# Patient Record
Sex: Male | Born: 1960 | Race: White | Hispanic: No | Smoking: Never smoker
Health system: Southern US, Community
[De-identification: ages and names within clinical notes are randomized; demographics above are authoritative.]

---

## 2005-07-25 ENCOUNTER — Ambulatory Visit (HOSPITAL_COMMUNITY): Admission: RE | Admit: 2005-07-25 | Discharge: 2005-07-26 | Payer: Self-pay | Admitting: Otolaryngology

## 2011-01-26 ENCOUNTER — Ambulatory Visit (HOSPITAL_COMMUNITY)
Admission: RE | Admit: 2011-01-26 | Discharge: 2011-01-26 | Disposition: A | Discharge status: Home / Self Care | Payer: BC Managed Care – HMO | Source: Ambulatory Visit | Attending: Ophthalmology | Admitting: Ophthalmology

## 2011-01-26 ENCOUNTER — Ambulatory Visit (HOSPITAL_COMMUNITY): Payer: BC Managed Care – HMO

## 2011-01-26 DIAGNOSIS — I10 Essential (primary) hypertension: Secondary | ICD-10-CM | POA: Insufficient documentation

## 2011-01-26 DIAGNOSIS — H33009 Unspecified retinal detachment with retinal break, unspecified eye: Secondary | ICD-10-CM | POA: Insufficient documentation

## 2011-01-26 LAB — BASIC METABOLIC PANEL
BUN: 15 mg/dL (ref 6–23)
CO2: 30 mEq/L (ref 19–32)
Calcium: 9.3 mg/dL (ref 8.4–10.5)
Chloride: 101 mEq/L (ref 96–112)
Creatinine, Ser: 0.82 mg/dL (ref 0.4–1.5)
GFR calc Af Amer: >60 mL/min (ref >60)
GFR calc non Af Amer: >60 mL/min (ref >60)
Glucose, Bld: 84 mg/dL (ref 70–99)
Potassium: 4.1 mEq/L (ref 3.5–5.1)
Sodium: 138 mEq/L (ref 135–145)

## 2011-01-26 LAB — CBC
HCT: 44 % (ref 39.0–52.0)
Hemoglobin: 15.4 g/dL (ref 13.0–17.0)
MCH: 32.6 pg (ref 26.0–34.0)
MCHC: 35 g/dL (ref 30.0–36.0)
MCV: 93 fL (ref 78.0–100.0)
Platelets: 189 10*3/uL (ref 150–400)
RBC: 4.73 MIL/uL (ref 4.22–5.81)
RDW: 11.6 % (ref 11.5–15.5)
WBC: 4.9 10*3/uL (ref 4.0–10.5)

## 2011-01-26 LAB — SURGICAL PCR SCREEN
MRSA, PCR: NEGATIVE
Staphylococcus aureus: NEGATIVE

## 2011-02-09 NOTE — Op Note (Signed)
[name], [name]                ACCOUNT NO.:  [id number]  MEDICAL RECORD NO.:  [id number]           PATIENT TYPE:  O  LOCATION:  [facility]                         FACILITY:  [facility]  PHYSICIAN:  Shade Flood, MD       DATE OF BIRTH:  Jan 03, 1961  DATE OF PROCEDURE:  01/26/2011 DATE OF DISCHARGE:  01/26/2011                              OPERATIVE REPORT   PREOPERATIVE DIAGNOSIS:  Rhegmatogenous retinal detachment, right eye.  POSTOPERATIVE DIAGNOSIS:  Rhegmatogenous retinal detachment, right eye.  PROCEDURE PERFORMED:  Pars plana vitrectomy with fluid-gas exchange, endolaser, silicone oil, right eye.  The patient was prepared and draped in the usual fashion for ocular surgery on the right eye and a solid lid speculum was placed.  A conjunctival displacement was performed using an utility conjunctival forceps.  The trocar cannula was placed at the 7:30 meridian.  The infusion line was then attached to the cannula and the tip of the cannula was visualized in the vitreous cavity and then secured to the drape.  Trocar cannulas were then placed at 9:30 and 2:30.  The light pipe and vitreous cutter were inserted.  The patient had a posterior vitreous detachment and an inferior rhegmatogenous retinal detachment with a small hole at the ora serrata at approximately the 5 o'clock meridian.  After removing the central portion of the vitreous which was separated, Perfluoron liquid was then placed over the posterior pole to ensure that the macula remained attached.  The macula was attached at the beginning of the procedure and remained so throughout the procedure.  The subretinal fluid was displaced anteriorly but the originating hole was too small for the fluid to exit.  A purposeful superior peripheral opening was made with vitreous cutter at approximately the 12:30 meridian near the vitreous base.  This was used to drain subretinal fluid using the silicone-tipped extrusion cannula.   Additional Perfluoron was used up to the posterior edge of this purposeful retinotomy.  Air- fluid exchange was then carried out successfully flattening the remaining portion of the retina anteriorly.  After the retina was fully be attached, then Perfluoron liquid was withdrawn using air- fluid exchange.    Endolaser was then applied around the purposeful retinotomy at the 12:30 meridian and over the inferior 3 clock hours between 3 o'clock and 6 o'clock.  Care was taken to treat along the vitreous base for the entire sector.  Residual pre-macular fluid  was removed with the silicone tipped extrusion  cannula. The Viscous fluid injection cannula  for 23-gauge was inserted and the silicone syringe for 5000 centistoke silicone oil was attached to it.  This silicone oil was then placed uneventfully allowing the gas to escape through the remaining open cannula.    Once the silicone liquid reached the back surface of the lens, a 26-gauge needle was used to remove the additional air while the air infusion line was clamped.  Once the air was removed, the palpable pressure of the globe was less than 10.  The superior cannulas were removed with concomitant closure using a cotton tip applicator.  The infusion cannula at the 7:30 meridian  was then removed in similar fashion.  The patient was given 100 mg of Ancef subconjunctivally using a 26-gauge needle under direct visualization.  The patient was given 2 mg of Decadron subconjunctivally in the same fashion.  The lid speculum and drapes were removed and the patient's eye was patched using polymyxin and bacitracin ointment.  A plastic eye shield was placed and he was uneventfully extubated.  He was transferred from the operating room to the postoperative recovery area with stable vital signs.          ______________________________ Shade Flood, MD     GG/MEDQ  D:  01/26/2011  T:  01/27/2011  Job:  [id number]  Electronically Signed by Shade Flood MD on 02/09/2011 09:28:41 AM

## 2011-06-07 ENCOUNTER — Ambulatory Visit (HOSPITAL_COMMUNITY)
Admission: RE | Admit: 2011-06-07 | Discharge: 2011-06-07 | Disposition: A | Discharge status: Home / Self Care | Payer: BC Managed Care – PPO | Source: Ambulatory Visit | Attending: Ophthalmology | Admitting: Ophthalmology

## 2011-06-07 DIAGNOSIS — S0550XA Penetrating wound with foreign body of unspecified eyeball, initial encounter: Secondary | ICD-10-CM | POA: Insufficient documentation

## 2011-06-07 DIAGNOSIS — H269 Unspecified cataract: Secondary | ICD-10-CM | POA: Insufficient documentation

## 2011-06-07 DIAGNOSIS — Z1889 Other specified retained foreign body fragments: Secondary | ICD-10-CM | POA: Insufficient documentation

## 2011-06-07 LAB — CBC
HCT: 43.8 % (ref 39.0–52.0)
Hemoglobin: 15.8 g/dL (ref 13.0–17.0)
MCH: 32.8 pg (ref 26.0–34.0)
MCHC: 36.1 g/dL — ABNORMAL HIGH (ref 30.0–36.0)
MCV: 91.1 fL (ref 78.0–100.0)
Platelets: 169 10*3/uL (ref 150–400)
RBC: 4.81 MIL/uL (ref 4.22–5.81)
RDW: 11.6 % (ref 11.5–15.5)
WBC: 5.5 10*3/uL (ref 4.0–10.5)

## 2011-06-07 LAB — BASIC METABOLIC PANEL
BUN: 16 mg/dL (ref 6–23)
CO2: 28 mEq/L (ref 19–32)
Calcium: 9.7 mg/dL (ref 8.4–10.5)
Chloride: 102 mEq/L (ref 96–112)
Creatinine, Ser: 0.67 mg/dL (ref 0.50–1.35)
GFR calc Af Amer: >60 mL/min (ref >60)
GFR calc non Af Amer: >60 mL/min (ref >60)
Glucose, Bld: 86 mg/dL (ref 70–99)
Potassium: 4 mEq/L (ref 3.5–5.1)
Sodium: 139 mEq/L (ref 135–145)

## 2011-06-07 LAB — SURGICAL PCR SCREEN
MRSA, PCR: NEGATIVE
Staphylococcus aureus: NEGATIVE

## 2011-07-05 NOTE — Op Note (Signed)
[name], [name]                ACCOUNT NO.:  [id number]  MEDICAL RECORD NO.:  [id number]  LOCATION:  [facility]                         FACILITY:  [facility]  PHYSICIAN:  Shade Flood, MD       DATE OF BIRTH:  1961-05-23  DATE OF PROCEDURE:  06/07/2011 DATE OF DISCHARGE:  06/07/2011                              OPERATIVE REPORT   PREOPERATIVE DIAGNOSES: 1. Prior retinal detachment repair using silicone oil. 2. Cataract, right eye.  PROCEDURE PERFORMED:  Pars plana vitrectomy with removal of silicone oil.  SECONDARY PROCEDURE:  Phacoemulsification with posterior chamber intraocular lens, right eye.  There were no complications.  No specimens for Pathology.  SURGEON:  Shade Flood, MD.  The patient was prepared and draped in the usual fashion for ocular surgery on the right eye and a solid lid speculum was placed.  The conjunctival peritomy was made at the 9:30 meridian.  The conjunctiva was cleaned and cauterized and infusion cannula was placed using a trocar at the 8:30 meridian and the infusion line was attached.  An MVR blade was then used to make an incision at the 9:30 meridian and a syringe with an 18-gauge Angiocath was placed through the incision and used to aspirate silicone oil from the posterior chamber while concurrent infusion was taking place.  Once the silicone oil was completely removed, the sclerotomy was closed using 7-0 Vicryl suture. A peripheral clear corneal incision was made for 3.5 mm centered at the 11 o'clock meridian and a 15 degrees blade was then used to make a stab incision at the 2:30 meridian of the clear cornea.  The keratome was then used to make a self-sealing incision into the anterior chamber.  A Bent 25-gauge needle was then used to perform a capsulorrhexis.  The lens was then hydrodissected using balanced salt solution and the lens nucleus was hydrodelineated.  The Chang chopper was inserted and used to rotate the lens within the capsular  bag to ensure adequate mobility. The phaco handpiece and Chang chopper were then inserted and a combined phaco chop technique was employed fracturing the lens into sections. The phaco handpiece was then used to remove the remaining fragments of the nucleus from the eye uneventfully.  The IA cannula was used to remove the cortex from the capsular bag.  The patient was noted to have a small area of fibrosis in the posterior capsule, which was not obscuring the visual axis.  I elected not to alter the capsule in the operating room.  Should it become visually significant I can address it with laser in postoperative period.    The wound was enlarged to its full extent and Provisc was placed into the anterior chamber.  The Monarch injector was then used to place a folded AcrySof MA50BM posterior chamber intraocular lens, + 23 diopters.  This was placed uneventfully into the capsule bag and the Sinskey lens hook was used to dial the trailing haptic into the capsular bag.  The IA cannula was then used to remove the viscoelastic from the anterior chamber and a single interrupted 10-0 nylon suture was placed to secure the wound while we did air-fluid exchange to remove residual silicone  droplets.  A trocar cannula was placed at 9:30 and at 2:30 and the light pipe and vitreous cutter were inserted.  Air-fluid exchange was carried out with removal of silicone droplets from the fluid interface then balanced salt solution was reinstilled in the posterior chamber and the air removed.  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  There was no peripheral retinal traction and no retinal breaks noted on careful examination.  The cannulas were then removed after the infusion was turned down to 10 mm and then the infusion cannula was removed with concomitant closure using a cotton tip applicator.  The patient  then had the conjunctiva closed at the 9:30 meridian using a 6-0 plain gut suture.  The subconjunctival space was irrigated using an Olive tip cannula with 100 mg of Ancef and 4 mg of Decadron.  The lid speculum was removed and the patient was given a retrobulbar block using 4 mL of 0.75% Marcaine.  The eye was patched using polymyxin, bacitracin, ophthalmic ointment, and a plastic shield was placed.  He was uneventfully extubated and transferred from the operating room to the postoperative recovery area.          ______________________________ Shade Flood, MD     GG/MEDQ  D:  06/07/2011  T:  06/08/2011  Job:  [id number]  Electronically Signed by Shade Flood MD on 07/05/2011 02:35:47 PM

## 2021-07-12 ENCOUNTER — Ambulatory Visit: Payer: Self-pay | Admitting: Cardiovascular Disease

## 2021-07-12 ENCOUNTER — Encounter: Payer: Self-pay | Admitting: Cardiovascular Disease

## 2021-07-12 ENCOUNTER — Other Ambulatory Visit: Payer: Self-pay

## 2021-07-12 ENCOUNTER — Ambulatory Visit (INDEPENDENT_AMBULATORY_CARE_PROVIDER_SITE_OTHER): Payer: 59 | Admitting: Cardiovascular Disease

## 2021-07-12 DIAGNOSIS — E785 Hyperlipidemia, unspecified: Secondary | ICD-10-CM | POA: Insufficient documentation

## 2021-07-12 DIAGNOSIS — E782 Mixed hyperlipidemia: Secondary | ICD-10-CM | POA: Diagnosis not present

## 2021-07-12 DIAGNOSIS — I480 Paroxysmal atrial fibrillation: Secondary | ICD-10-CM | POA: Insufficient documentation

## 2021-07-12 DIAGNOSIS — I10 Essential (primary) hypertension: Secondary | ICD-10-CM | POA: Diagnosis not present

## 2021-07-12 NOTE — Progress Notes (Signed)
07/12/2021 [name]   June 29, 1961  [id number]  Primary Physician System, Provider Not In Primary Cardiologist: Runell Gess MD Nicholes Calam[place], [place]  HPI:  DRAYDON CLAIRMONT is a 60 y.o. mildly overweight divorced Caucasian male father of 3 children who works as a Psychologist, forensic.  He was referred by Dr. Wylene Simmer,  His PCP, for evaluation of PAF.  His cardiac risk factor profile is notable for treated hypertension and hyperlipidemia.  He does drink 3-5 diet Cokes a day.  There is no history of sleep apnea.  There is no family history of heart disease although his mother did have A. fib and had a stroke.  He has never had a heart attack or stroke.  Denies chest pain or shortness of breath.  He does walk 3-4 times a week and plays occasional tennis.  He developed a presyncopal episode on 06/23/2021 and was seen by his primary care doctor who noted that he was in A. fib.   Current Meds  Medication Sig   acyclovir (ZOVIRAX) 400 MG tablet Take 400 mg by mouth daily.   ELIQUIS 5 MG TABS tablet Take 5 mg by mouth 2 (two) times daily.   folic acid (FOLVITE) 400 MCG tablet Take 400 mg by mouth daily.   latanoprost (XALATAN) 0.005 % ophthalmic solution Place into both eyes at bedtime.   metoprolol tartrate (LOPRESSOR) 25 MG tablet Take 25 mg by mouth 2 (two) times daily.   Omega-3 1000 MG CAPS Take by mouth daily.   simvastatin (ZOCOR) 40 MG tablet Take 40 mg by mouth every evening.   timolol (TIMOPTIC) 0.5 % ophthalmic solution Place 1 drop into both eyes in the morning.     Not on File  Social History   Socioeconomic History   Marital status: Married    Spouse name: Not on file   Number of children: Not on file   Years of education: Not on file   Highest education level: Not on file  Occupational History   Not on file  Tobacco Use   Smoking status: Never   Smokeless tobacco: Never  Substance and Sexual Activity   Alcohol use: Not on file   Drug use: Not on file    Sexual activity: Not on file  Other Topics Concern   Not on file  Social History Narrative   Not on file   Social Determinants of Health   Financial Resource Strain: Not on file  Food Insecurity: Not on file  Transportation Needs: Not on file  Physical Activity: Not on file  Stress: Not on file  Social Connections: Not on file  Intimate Partner Violence: Not on file     Review of Systems: General: negative for chills, fever, night sweats or weight changes.  Cardiovascular: negative for chest pain, dyspnea on exertion, edema, orthopnea, palpitations, paroxysmal nocturnal dyspnea or shortness of breath Dermatological: negative for rash Respiratory: negative for cough or wheezing Urologic: negative for hematuria Abdominal: negative for nausea, vomiting, diarrhea, bright red blood per rectum, melena, or hematemesis Neurologic: negative for visual changes, syncope, or dizziness All other systems reviewed and are otherwise negative except as noted above.    Blood pressure 112/80, pulse 60, height 5\' 11"  (1.803 m), weight 194 lb 12.8 oz (88.4 kg), SpO2 99 %.  General appearance: alert and no distress Neck: no adenopathy, no carotid bruit, no JVD, supple, symmetrical, trachea midline, and thyroid not enlarged, symmetric, no tenderness/mass/nodules Lungs: clear to auscultation bilaterally  Heart: regular rate and rhythm, S1, S2 normal, no murmur, click, rub or gallop Extremities: extremities normal, atraumatic, no cyanosis or edema Pulses: 2+ and symmetric Skin: Skin color, texture, turgor normal. No rashes or lesions Neurologic: Grossly normal  EKG sinus rhythm at 60 without ST or T wave changes.  I personally reviewed this EKG.  ASSESSMENT AND PLAN:   Essential hypertension History of essential hypertension blood pressure measured today at 112/80.  He is on metoprolol.  Hyperlipidemia History of hyperlipidemia on statin therapy followed by his PCP  PAF (paroxysmal atrial  fibrillation) (HCC) History of PAF with recent presyncopal episode on 06/23/2021.  He saw his primary care doctor who demonstrated A. fib.  He is placed on Eliquis oral anticoagulation and metoprolol.  He is currently in sinus rhythm. This patients CHA2DS2-VASc Score and unadjusted Ischemic Stroke Rate (% per year) is equal to 0.6 % stroke rate/year from a score of 1  Above score calculated as 1 point each if present [CHF, HTN, DM, Vascular=MI/PAD/Aortic Plaque, Age if 65-74, or Male] Above score calculated as 2 points each if present [Age > 75, or Stroke/TIA/TE]      Runell Gess MD [facility], [facility] 07/12/2021 10:51 AM

## 2021-07-12 NOTE — Assessment & Plan Note (Signed)
History of essential hypertension blood pressure measured today at 112/80.  He is on metoprolol.

## 2021-07-12 NOTE — Assessment & Plan Note (Signed)
History of PAF with recent presyncopal episode on 06/23/2021.  He saw his primary care doctor who demonstrated A. fib.  He is placed on Eliquis oral anticoagulation and metoprolol.  He is currently in sinus rhythm. This patients CHA2DS2-VASc Score and unadjusted Ischemic Stroke Rate (% per year) is equal to 0.6 % stroke rate/year from a score of 1  Above score calculated as 1 point each if present [CHF, HTN, DM, Vascular=MI/PAD/Aortic Plaque, Age if 65-74, or Male] Above score calculated as 2 points each if present [Age > 75, or Stroke/TIA/TE]

## 2021-07-12 NOTE — Patient Instructions (Addendum)
Medication Instructions:  Your physician recommends that you continue on your current medications as directed. Please refer to the Current Medication list given to you today.  *If you need a refill on your cardiac medications before your next appointment, please call your pharmacy*   Testing/Procedures: Your physician has requested that you have an echocardiogram. Echocardiography is a painless test that uses sound waves to create images of your heart. It provides your doctor with information about the size and shape of your heart and how well your heart's chambers and valves are working. This procedure takes approximately one hour. There are no restrictions for this procedure. This procedure is done at [address]   Dr. Gwenlyn Found has ordered a CT coronary calcium score. This test is done at [address]. This is $99 out of pocket.   Coronary CalciumScan A coronary calcium scan is an imaging test used to look for deposits of calcium and other fatty materials (plaques) in the inner lining of the blood vessels of the heart (coronary arteries). These deposits of calcium and plaques can partly clog and narrow the coronary arteries without producing any symptoms or warning signs. This puts a person at risk for a heart attack. This test can detect these deposits before symptoms develop. Tell a health care provider about: Any allergies you have. All medicines you are taking, including vitamins, herbs, eye drops, creams, and over-the-counter medicines. Any problems you or family members have had with anesthetic medicines. Any blood disorders you have. Any surgeries you have had. Any medical conditions you have. Whether you are pregnant or may be pregnant. What are the risks? Generally, this is a safe procedure. However, problems may occur, including: Harm to a pregnant woman and her unborn baby. This test involves the use of radiation. Radiation exposure can be dangerous to a  pregnant woman and her unborn baby. If you are pregnant, you generally should not have this procedure done. Slight increase in the risk of cancer. This is because of the radiation involved in the test. What happens before the procedure? No preparation is needed for this procedure. What happens during the procedure? You will undress and remove any jewelry around your neck or chest. You will put on a hospital gown. Sticky electrodes will be placed on your chest. The electrodes will be connected to an electrocardiogram (ECG) machine to record a tracing of the electrical activity of your heart. A CT scanner will take pictures of your heart. During this time, you will be asked to lie still and hold your breath for 2-3 seconds while a picture of your heart is being taken. The procedure may vary among health care providers and hospitals. What happens after the procedure? You can get dressed. You can return to your normal activities. It is up to you to get the results of your test. Ask your health care provider, or the department that is doing the test, when your results will be ready. Summary A coronary calcium scan is an imaging test used to look for deposits of calcium and other fatty materials (plaques) in the inner lining of the blood vessels of the heart (coronary arteries). Generally, this is a safe procedure. Tell your health care provider if you are pregnant or may be pregnant. No preparation is needed for this procedure. A CT scanner will take pictures of your heart. You can return to your normal activities after the scan is done. This information is not intended to replace advice  given to you by your health care provider. Make sure you discuss any questions you have with your health care provider. Document Released: 03/30/2008 Document Revised: 08/21/2016 Document Reviewed: 08/21/2016 Elsevier Interactive Patient Education  2017 Elsevier Inc.  Christena Deem- Long Term Monitor  Instructions  Your physician has requested you wear a ZIO patch monitor for 14 days.  This is a single patch monitor. [employer] supplies one patch monitor per enrollment. Additional stickers are not available. Please do not apply patch if you will be having a Nuclear Stress Test,  Echocardiogram, Cardiac CT, MRI, or Chest Xray during the period you would be wearing the  monitor. The patch cannot be worn during these tests. You cannot remove and re-apply the  ZIO XT patch monitor.  Your ZIO patch monitor will be mailed 3 day USPS to your address on file. It may take 3-5 days  to receive your monitor after you have been enrolled.  Once you have received your monitor, please review the enclosed instructions. Your monitor  has already been registered assigning a specific monitor serial # to you.  Billing and Patient Assistance Program Information  We have supplied [employer] with any of your insurance information on file for billing purposes. [employer] offers a sliding scale Patient Assistance Program for patients that do not have  insurance, or whose insurance does not completely cover the cost of the ZIO monitor.  You must apply for the Patient Assistance Program to qualify for this discounted rate.  To apply, please call [employer] at [phone], select option 4, select option 2, ask to apply for  Patient Assistance Program. Meredeth Ide will ask your household income, and how many people  are in your household. They will quote your out-of-pocket cost based on that information.  [employer] will also be able to set up a 24-month, interest-free payment plan if needed.  Applying the monitor   Shave hair from upper left chest.  Hold abrader disc by orange tab. Rub abrader in 40 strokes over the upper left chest as  indicated in your monitor instructions.  Clean area with 4 enclosed alcohol pads. Let dry.  Apply patch as indicated in monitor instructions. Patch will be placed under collarbone on left  side  of chest with arrow pointing upward.  Rub patch adhesive wings for 2 minutes. Remove white label marked "1". Remove the white  label marked "2". Rub patch adhesive wings for 2 additional minutes.  While looking in a mirror, press and release button in center of patch. A small green light will  flash 3-4 times. This will be your only indicator that the monitor has been turned on.   Do not shower for the first 24 hours. You may shower after the first 24 hours.   Press the button if you feel a symptom. You will hear a small click. Record Date, Time and  Symptom in the Patient Logbook.  When you are ready to remove the patch, follow instructions on the last 2 pages of Patient  Logbook. Stick patch monitor onto the last page of Patient Logbook.  Place Patient Logbook in the blue and white box. Use locking tab on box and tape box closed  securely. The blue and white box has prepaid postage on it. Please place it in the mailbox as  soon as possible. Your physician should have your test results approximately 7 days after the  monitor has been mailed back to [facility].  Call [facility] Customer Care at [phone] if you have  questions regarding  your ZIO XT patch monitor. Call them immediately if you see an orange light blinking on your  monitor.  If your monitor falls off in less than 4 days, contact our Monitor department at [phone].  If your monitor becomes loose or falls off after 4 days call [employer] at [phone] for  suggestions on securing your monitor   Follow-Up: At [facility], you and your health needs are our priority.  As part of our continuing mission to provide you with exceptional heart care, we have created designated Provider Care Teams.  These Care Teams include your primary Cardiologist (physician) and Advanced Practice Providers (APPs -  Physician Assistants and Nurse Practitioners) who all work together to provide you with the care you need, when you need  it.  We recommend signing up for the patient portal called "MyChart".  Sign up information is provided on this After Visit Summary.  MyChart is used to connect with patients for Virtual Visits (Telemedicine).  Patients are able to view lab/test results, encounter notes, upcoming appointments, etc.  Non-urgent messages can be sent to your provider as well.   To learn more about what you can do with MyChart, go to [email or web address].    Your next appointment:   6 month(s)  The format for your next appointment:   In Person  Provider:   Nanetta Batty, MD   Other Instructions Referral made to the [facility].

## 2021-07-12 NOTE — Assessment & Plan Note (Signed)
History of hyperlipidemia on statin therapy followed by his PCP 

## 2021-07-14 ENCOUNTER — Other Ambulatory Visit: Payer: Self-pay

## 2021-07-14 NOTE — Addendum Note (Signed)
Addended by: Derenda Fennel on: 07/14/2021 03:08 PM   Modules accepted: Orders

## 2021-07-21 ENCOUNTER — Ambulatory Visit (HOSPITAL_BASED_OUTPATIENT_CLINIC_OR_DEPARTMENT_OTHER)
Admission: RE | Admit: 2021-07-21 | Discharge: 2021-07-21 | Disposition: A | Discharge status: Home / Self Care | Payer: 59 | Source: Ambulatory Visit | Attending: Cardiovascular Disease | Admitting: Cardiovascular Disease

## 2021-07-21 ENCOUNTER — Ambulatory Visit (INDEPENDENT_AMBULATORY_CARE_PROVIDER_SITE_OTHER): Payer: 59

## 2021-07-21 ENCOUNTER — Other Ambulatory Visit: Payer: Self-pay

## 2021-07-21 DIAGNOSIS — I10 Essential (primary) hypertension: Secondary | ICD-10-CM | POA: Diagnosis not present

## 2021-07-21 DIAGNOSIS — I77819 Aortic ectasia, unspecified site: Secondary | ICD-10-CM | POA: Insufficient documentation

## 2021-07-21 DIAGNOSIS — E785 Hyperlipidemia, unspecified: Secondary | ICD-10-CM | POA: Insufficient documentation

## 2021-07-21 DIAGNOSIS — E782 Mixed hyperlipidemia: Secondary | ICD-10-CM | POA: Diagnosis not present

## 2021-07-21 DIAGNOSIS — I480 Paroxysmal atrial fibrillation: Secondary | ICD-10-CM | POA: Diagnosis not present

## 2021-07-22 ENCOUNTER — Other Ambulatory Visit (HOSPITAL_BASED_OUTPATIENT_CLINIC_OR_DEPARTMENT_OTHER): Payer: 59

## 2021-07-22 LAB — ECHOCARDIOGRAM COMPLETE
Area-P 1/2: 2.64 cm2
P 1/2 time: 703 msec
S' Lateral: 2.91 cm

## 2021-07-26 ENCOUNTER — Telehealth: Payer: Self-pay | Admitting: Cardiovascular Disease

## 2021-07-26 NOTE — Telephone Encounter (Signed)
Please call patient to set up holter monitor.  He also stated his insurance company would like to be contacted first to see if they will cover it, reference #[id number].

## 2021-07-27 ENCOUNTER — Ambulatory Visit (INDEPENDENT_AMBULATORY_CARE_PROVIDER_SITE_OTHER): Payer: 59

## 2021-07-27 DIAGNOSIS — I480 Paroxysmal atrial fibrillation: Secondary | ICD-10-CM

## 2021-07-27 NOTE — Progress Notes (Unsigned)
See 07/27/21 phone note. Patient enrolled for [employer] to mail a 14 day ZIO XT monitor to his address on file. My Chart message with instructions sent to patient.

## 2021-07-27 NOTE — Telephone Encounter (Signed)
Apologized to patient for delay in him receiving the 14 day ZIO XT monitor ordered by Dr. Allyson Sabal on 07/12/21.   Order was placed to be completed by our [place] office in error.  The [facility] monitor department which processes all of the [place] offices monitors was consequently never informed of the order, (did not fall into [employer] work que). Explained to patient I will process the order today and send him instructions via My Chart. His monitor will be mailed to him from our vendor, [place], today.  He should receive it in about 3 days.  The are the company that will bill his insurance company.  Their number will be on the instructions.  Patient can call that number to request an out of pocket cost quote.  If patient chooses not to proceed with the monitor, just mail the monitor back , unused, to [place] using the monitor box with return prepaid postage on it.  Patient will not be charged for any monitor returned unused.

## 2021-08-03 DIAGNOSIS — I480 Paroxysmal atrial fibrillation: Secondary | ICD-10-CM | POA: Diagnosis not present

## 2021-08-18 ENCOUNTER — Ambulatory Visit (HOSPITAL_COMMUNITY): Payer: 59 | Admitting: Physician Assistant

## 2021-08-26 ENCOUNTER — Encounter (HOSPITAL_COMMUNITY): Payer: Self-pay | Admitting: Physician Assistant

## 2021-08-26 ENCOUNTER — Ambulatory Visit (HOSPITAL_COMMUNITY)
Admission: RE | Admit: 2021-08-26 | Discharge: 2021-08-26 | Disposition: A | Discharge status: Home / Self Care | Payer: 59 | Source: Ambulatory Visit | Attending: Physician Assistant | Admitting: Physician Assistant

## 2021-08-26 VITALS — BP 120/86 | HR 71 | Ht 71.0 in | Wt 193.0 lb

## 2021-08-26 DIAGNOSIS — I480 Paroxysmal atrial fibrillation: Secondary | ICD-10-CM | POA: Insufficient documentation

## 2021-08-26 DIAGNOSIS — Z7901 Long term (current) use of anticoagulants: Secondary | ICD-10-CM | POA: Insufficient documentation

## 2021-08-26 DIAGNOSIS — E785 Hyperlipidemia, unspecified: Secondary | ICD-10-CM | POA: Diagnosis not present

## 2021-08-26 DIAGNOSIS — I10 Essential (primary) hypertension: Secondary | ICD-10-CM | POA: Insufficient documentation

## 2021-08-26 NOTE — Progress Notes (Signed)
Primary Care Physician: System, Provider Not In Primary Cardiologist: Dr Allyson Sabal Primary Electrophysiologist: none Referring Physician: Dr Terance Hart is a 60 y.o. male with a history of HTN, HLD, atrial fibrillation who presents for consultation in the [facility].  The patient was initially diagnosed with atrial fibrillation at his PCP office after presenting with symptoms of dizziness and presyncope which had been going on for several days. ECG showed afib with HR 106 (personally reviewed). Patient was started on Eliquis for a CHADS2VASC score of 1 and his atenolol was changed to metoprolol for rate control. In hindsight, patient states he has had 3-4 of these episodes since 2015. He denies significant snoring and only drinks alcohol occasionally.   Today, he denies symptoms of palpitations, chest pain, shortness of breath, orthopnea, PND, lower extremity edema, dizziness, presyncope, syncope, snoring, daytime somnolence, bleeding, or neurologic sequela. The patient is tolerating medications without difficulties and is otherwise without complaint today.    Atrial Fibrillation Risk Factors:  he does not have symptoms or diagnosis of sleep apnea. he does not have a history of rheumatic fever. he does not have a history of alcohol use. The patient does not have a history of early familial atrial fibrillation or other arrhythmias.  he has a BMI of Body mass index is 26.92 kg/m.Marland Kitchen Filed Weights   08/26/21 0938  Weight: 87.5 kg    No family history on file.   Atrial Fibrillation Management history:  Previous antiarrhythmic drugs: none Previous cardioversions: none Previous ablations: none CHADS2VASC score: 1 Anticoagulation history: Eliquis   No past medical history on file. No past surgical history on file.  Current Outpatient Medications  Medication Sig Dispense Refill   acyclovir (ZOVIRAX) 400 MG tablet Take 400 mg by mouth daily.      Biotin w/ Vitamins C & E (HAIR/SKIN/NAILS) 1250-7.5-7.5 MCG-MG-UNT CHEW Chew by mouth.     ELIQUIS 5 MG TABS tablet Take 5 mg by mouth 2 (two) times daily.     folic acid (FOLVITE) 400 MCG tablet Take 400 mg by mouth daily.     latanoprost (XALATAN) 0.005 % ophthalmic solution Place into both eyes at bedtime.     metoprolol tartrate (LOPRESSOR) 25 MG tablet Take 25 mg by mouth 2 (two) times daily.     MINOXIDIL, TOPICAL, (ROGAINE EXTRA STRENGTH) 5 % SOLN Apply topically.     Omega-3 1000 MG CAPS Take by mouth daily.     simvastatin (ZOCOR) 40 MG tablet Take 40 mg by mouth every evening.     timolol (TIMOPTIC) 0.5 % ophthalmic solution Place 1 drop into both eyes in the morning.     No current facility-administered medications for this encounter.    No Known Allergies  Social History   Socioeconomic History   Marital status: Divorced    Spouse name: Not on file   Number of children: Not on file   Years of education: Not on file   Highest education level: Not on file  Occupational History   Not on file  Tobacco Use   Smoking status: Never   Smokeless tobacco: Never  Substance and Sexual Activity   Alcohol use: Yes    Alcohol/week: 4.0 - 8.0 standard drinks    Types: 4 - 8 Cans of beer per week    Comment: 2-4 beers every other weekend 08/26/2021   Drug use: Never   Sexual activity: Not on file  Other Topics Concern   Not  on file  Social History Narrative   Not on file   Social Determinants of Health   Financial Resource Strain: Not on file  Food Insecurity: Not on file  Transportation Needs: Not on file  Physical Activity: Not on file  Stress: Not on file  Social Connections: Not on file  Intimate Partner Violence: Not on file     ROS- All systems are reviewed and negative except as per the HPI above.  Physical Exam: Vitals:   08/26/21 0938  BP: 120/86  Pulse: 71  Weight: 87.5 kg  Height: 5\' 11"  (1.803 m)    GEN- The patient is a well appearing male,  alert and oriented x 3 today.   Head- normocephalic, atraumatic Eyes-  Sclera clear, conjunctiva pink Ears- hearing intact Oropharynx- clear Neck- supple  Lungs- Clear to ausculation bilaterally, normal work of breathing Heart- Regular rate and rhythm, no murmurs, rubs or gallops  GI- soft, NT, ND, + BS Extremities- no clubbing, cyanosis, or edema MS- no significant deformity or atrophy Skin- no rash or lesion Psych- euthymic mood, full affect Neuro- strength and sensation are intact  Wt Readings from Last 3 Encounters:  08/26/21 87.5 kg  07/12/21 88.4 kg    EKG today demonstrates  SR Vent. rate 71 BPM PR interval 156 ms QRS duration 90 ms QT/QTcB 396/430 ms  Echo 07/21/21 demonstrated   1. Left ventricular ejection fraction, by estimation, is 60 to 65%. Left ventricular ejection fraction by 3D volume is 63 %. The left ventricle has normal function. The left ventricle has no regional wall motion abnormalities. There is mild concentric left ventricular hypertrophy. Left ventricular diastolic parameters are indeterminate.   2. Right ventricular systolic function is normal. The right ventricular  size is normal.   3. Left atrial size was moderately dilated.   4. The mitral valve is normal in structure. Trivial mitral valve  regurgitation. No evidence of mitral stenosis.   5. The aortic valve is normal in structure. Aortic valve regurgitation is trivial. No aortic stenosis is present.   6. Aortic dilatation noted.   7. The inferior vena cava is normal in size with greater than 50%  respiratory variability, suggesting right atrial pressure of 3 mmHg.   Epic records are reviewed at length today  CHA2DS2-VASc Score = 1  The patient's score is based upon: CHF History: 0 HTN History: 1 Diabetes History: 0 Stroke History: 0 Vascular Disease History: 0 Age Score: 0 Gender Score: 0      ASSESSMENT AND PLAN: 1. Paroxysmal Atrial Fibrillation (ICD10:  I48.0) The patient's  CHA2DS2-VASc score is 1, indicating a 0.6% annual risk of stroke.   General education about afib provided and questions answered. We also discussed his stroke risk and the risks and benefits of anticoagulation. Patient prefers to remain on anticoagulation for now with CV score of 1 and family h/o CVA. Continue Lopressor 25 mg BID Patient plans to get a smart watch for home monitoring.  We discussed rhythm control options including AAD and ablation. Given the infrequency of his episodes, patient would like to continue present therapy for now.  2. HTN Stable, no changes today.   Follow up in the AF clinic in 6 months.    09/20/21 PA-C Afib Clinic [facility] [address] [place], [place] [phone] 08/26/2021 9:47 AM

## 2022-01-10 ENCOUNTER — Encounter: Payer: Self-pay | Admitting: Cardiovascular Disease

## 2022-01-10 ENCOUNTER — Ambulatory Visit (INDEPENDENT_AMBULATORY_CARE_PROVIDER_SITE_OTHER): Payer: 59 | Admitting: Cardiovascular Disease

## 2022-01-10 ENCOUNTER — Other Ambulatory Visit: Payer: Self-pay

## 2022-01-10 VITALS — BP 118/80 | HR 67 | Ht 71.0 in | Wt 195.0 lb

## 2022-01-10 DIAGNOSIS — I10 Essential (primary) hypertension: Secondary | ICD-10-CM | POA: Diagnosis not present

## 2022-01-10 DIAGNOSIS — E782 Mixed hyperlipidemia: Secondary | ICD-10-CM

## 2022-01-10 DIAGNOSIS — I480 Paroxysmal atrial fibrillation: Secondary | ICD-10-CM

## 2022-01-10 NOTE — Assessment & Plan Note (Signed)
History of paroxysmal atrial fibrillation currently on Eliquis oral anticoagulation ( The CHA2DSVASC2 score is 1  ).  He wishes to remain on Eliquis because of family history of stroke despite a low CHA2DSVaSC score.  He did have an event monitor that showed some episodes of SVT but no A-fib.  He did have an episode of A-fib on 11/04/2021 for less than 7 hours.  I reviewed the tracings on his Apple Watch and confirmed this was A-fib.  This happened after drinking 2 Stevia Coca-Cola's.  His heart rate got up to 188.  I told him that if his heart rate got up this high in the future he could take an extra metoprolol. ?

## 2022-01-10 NOTE — Progress Notes (Signed)
? ? ? ?01/10/2022 ?[name]   ?14-Sep-1961  ?[id number] ? ?Primary Physician Tisovec, Fransico Him, MD ?Primary Cardiologist: Lorretta Harp MD [name], [place] ? ?HPI:  [name] is a 61 y.o.  mildly overweight divorced Caucasian male father of 3 children who works as a Medical laboratory scientific officer.  He was referred by Dr. Osborne Casco,  his  PCP, for evaluation of PAF.  I last saw him in the office 07/12/2021.  His cardiac risk factor profile is notable for treated hypertension and hyperlipidemia.  He does drink 3-5 diet Cokes a day.  There is no history of sleep apnea.  There is no family history of heart disease although his mother did have A. fib and had a stroke.  He has never had a heart attack or stroke.  Denies chest pain or shortness of breath.  He does walk 3-4 times a week and plays occasional tennis.  He developed a presyncopal episode on 06/23/2021 and was seen by his primary care doctor who noted that he was in A. fib. ? ?I obtained an event monitor that showed short runs of SVT but no A-fib.  A 2D echo was normal and a coronary calcium score was 6.7.  He has had 1 episode of PAF on 11/04/2021 after drinking 2 Stevia Coca-Cola's.  His heart rate up to 188 on his Apple watch.  He fell asleep and in the morning was back in normal sinus rhythm.  He was completely asymptomatic during this episode. ? ? ?Current Meds  ?Medication Sig  ? acyclovir (ZOVIRAX) 400 MG tablet Take 400 mg by mouth daily.  ? Biotin w/ Vitamins C & E (HAIR/SKIN/NAILS) 1250-7.5-7.5 MCG-MG-UNT CHEW Chew by mouth.  ? ELIQUIS 5 MG TABS tablet Take 5 mg by mouth 2 (two) times daily.  ? folic acid (FOLVITE) [id number] MCG tablet Take 400 mg by mouth daily.  ? latanoprost (XALATAN) 0.005 % ophthalmic solution Place into both eyes at bedtime.  ? metoprolol tartrate (LOPRESSOR) 25 MG tablet Take 25 mg by mouth 2 (two) times daily.  ? MINOXIDIL, TOPICAL, (ROGAINE EXTRA STRENGTH) 5 % SOLN Apply topically.  ? Omega-3 1000 MG CAPS Take by mouth  daily.  ? simvastatin (ZOCOR) 40 MG tablet Take 40 mg by mouth every evening.  ?  ? ?No Known Allergies ? ?Social History  ? ?Socioeconomic History  ? Marital status: Divorced  ?  Spouse name: Not on file  ? Number of children: Not on file  ? Years of education: Not on file  ? Highest education level: Not on file  ?Occupational History  ? Not on file  ?Tobacco Use  ? Smoking status: Never  ? Smokeless tobacco: Never  ?Substance and Sexual Activity  ? Alcohol use: Yes  ?  Alcohol/week: 4.0 - 8.0 standard drinks  ?  Types: 4 - 8 Cans of beer per week  ?  Comment: 2-4 beers every other weekend 08/26/2021  ? Drug use: Never  ? Sexual activity: Not on file  ?Other Topics Concern  ? Not on file  ?Social History Narrative  ? Not on file  ? ?Social Determinants of Health  ? ?Financial Resource Strain: Not on file  ?Food Insecurity: Not on file  ?Transportation Needs: Not on file  ?Physical Activity: Not on file  ?Stress: Not on file  ?Social Connections: Not on file  ?Intimate Partner Violence: Not on file  ?  ? ?Review of Systems: ?General: negative for chills, fever, night sweats  or weight changes.  ?Cardiovascular: negative for chest pain, dyspnea on exertion, edema, orthopnea, palpitations, paroxysmal nocturnal dyspnea or shortness of breath ?Dermatological: negative for rash ?Respiratory: negative for cough or wheezing ?Urologic: negative for hematuria ?Abdominal: negative for nausea, vomiting, diarrhea, bright red blood per rectum, melena, or hematemesis ?Neurologic: negative for visual changes, syncope, or dizziness ?All other systems reviewed and are otherwise negative except as noted above. ? ? ? ?Blood pressure 118/80, pulse 67, height 5\' 11"  (1.803 m), weight 195 lb (88.5 kg).  ?General appearance: alert and no distress ?Neck: no adenopathy, no carotid bruit, no JVD, supple, symmetrical, trachea midline, and thyroid not enlarged, symmetric, no tenderness/mass/nodules ?Lungs: clear to auscultation  bilaterally ?Heart: regular rate and rhythm, S1, S2 normal, no murmur, click, rub or gallop ?Extremities: extremities normal, atraumatic, no cyanosis or edema ?Pulses: 2+ and symmetric ?Skin: Skin color, texture, turgor normal. No rashes or lesions ?Neurologic: Grossly normal ? ?EKG sinus rhythm at 67 with left axis deviation.  I personally reviewed this EKG. ? ?ASSESSMENT AND PLAN:  ? ?Essential hypertension ?History of essential hypertension blood pressure measured today at 118/80.  He is on metoprolol. ? ?Hyperlipidemia ?History of hyperlipidemia on simvastatin 40 mg a day with lipid profile performed 10/26/2021 revealing total cholesterol 152, LDL of 96 and HDL 45.  He did have a coronary calcium score performed 07/21/2021 which was 6.7 all located in the LAD.  Since this was fairly minimal, I think an LDL of 96 is adequate for primary prevention. ? ?Paroxysmal atrial fibrillation ([place]) ?History of paroxysmal atrial fibrillation currently on Eliquis oral anticoagulation ( The CHA2DSVASC2 score is 1  ).  He wishes to remain on Eliquis because of family history of stroke despite a low CHA2DSVaSC score.  He did have an event monitor that showed some episodes of SVT but no A-fib.  He did have an episode of A-fib on 11/04/2021 for less than 7 hours.  I reviewed the tracings on his Apple Watch and confirmed this was A-fib.  This happened after drinking 2 Stevia Coca-Cola's.  His heart rate got up to 188.  I told him that if his heart rate got up this high in the future he could take an extra metoprolol. ? ? ? ? ?Lorretta Harp MD FACP,FACC,FAHA, FSCAI ?01/10/2022 ?10:47 AM ?

## 2022-01-10 NOTE — Assessment & Plan Note (Signed)
History of essential hypertension blood pressure measured today at 118/80.  He is on metoprolol. ?

## 2022-01-10 NOTE — Patient Instructions (Signed)

## 2022-01-10 NOTE — Assessment & Plan Note (Signed)
History of hyperlipidemia on simvastatin 40 mg a day with lipid profile performed 10/26/2021 revealing total cholesterol 152, LDL of 96 and HDL 45.  He did have a coronary calcium score performed 07/21/2021 which was 6.7 all located in the LAD.  Since this was fairly minimal, I think an LDL of 96 is adequate for primary prevention. ?

## 2022-08-27 IMAGING — CT CT CARDIAC CORONARY ARTERY CALCIUM SCORE
3 series · 13 of 20 positions shown, 15 images · non-contrast
Comparison: Calcium score

Addendum:
CLINICAL DATA: Cardiovascular Disease Risk stratification

EXAM:
Coronary Calcium Score
TECHNIQUE: A gated, non-contrast computed tomography scan of the heart was
performed using 3mm slice thickness. Axial images were analyzed on a
dedicated workstation. Calcium scoring of the coronary arteries was
performed using the Agatston method.

[Series 2: ax lung · axial · 0.83mm/px · z∈[+1124,+1214]mm · 5 of 69 slices shown]
[im 12/69  lung]
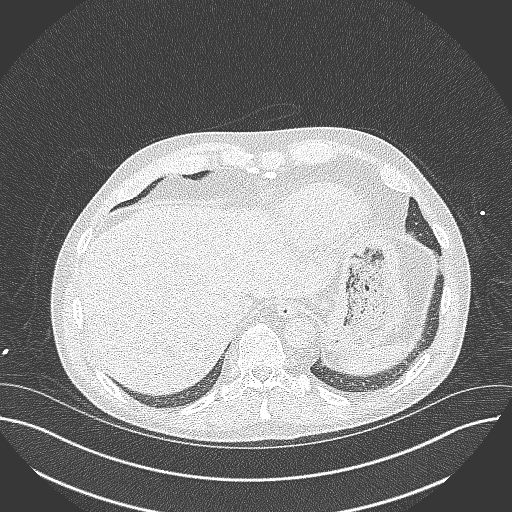
[im 23/69  lung]
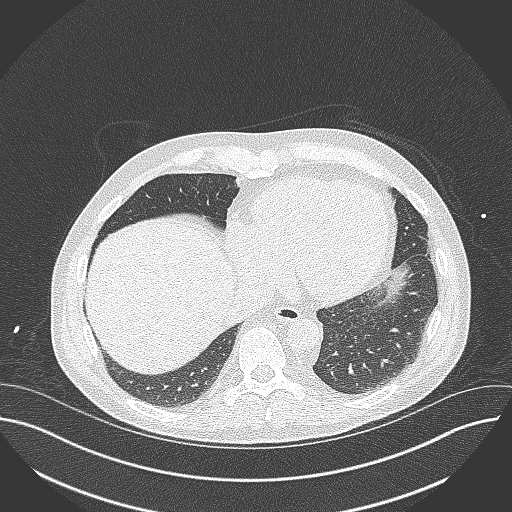
[im 35/69  lung]
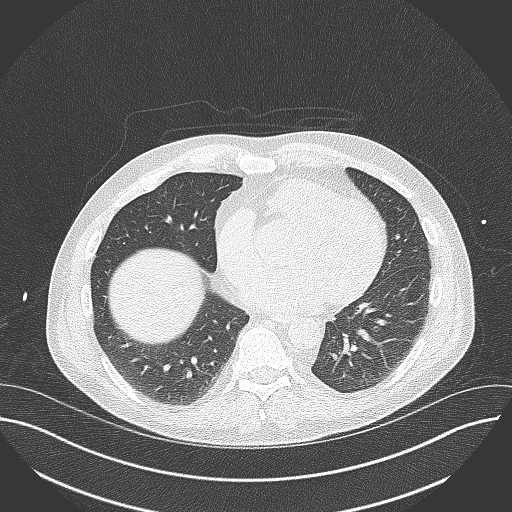
[im 46/69  lung]
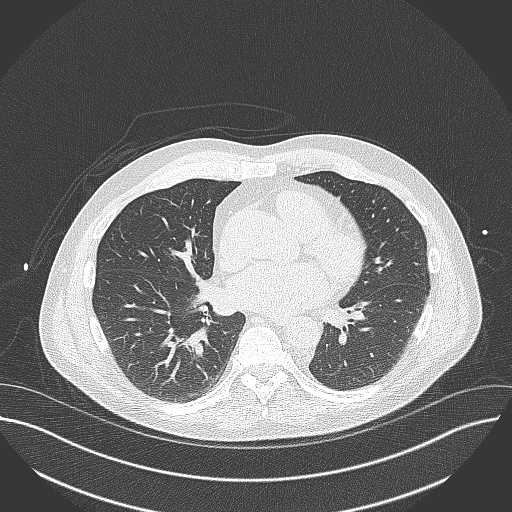
[im 57/69  lung]
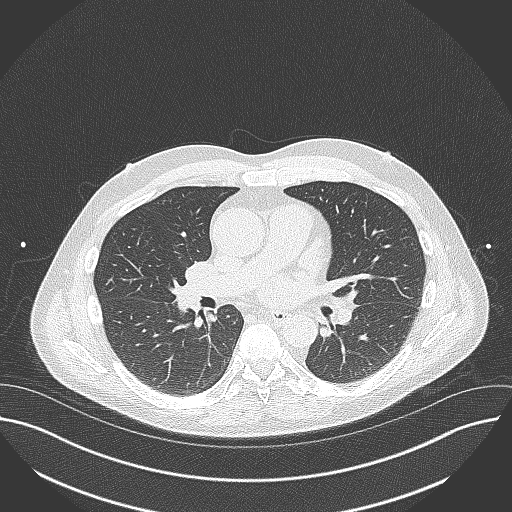

[Series 3: cascseq 3.0 sa36 70% (id) · axial · 0.39mm/px · z∈[+1135,+1168]mm · 2 of 46 slices shown]
[im 12/46  vessel]
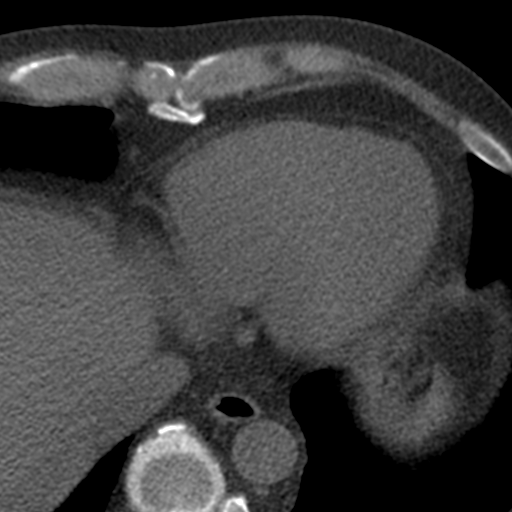
[im 23/46  vessel]
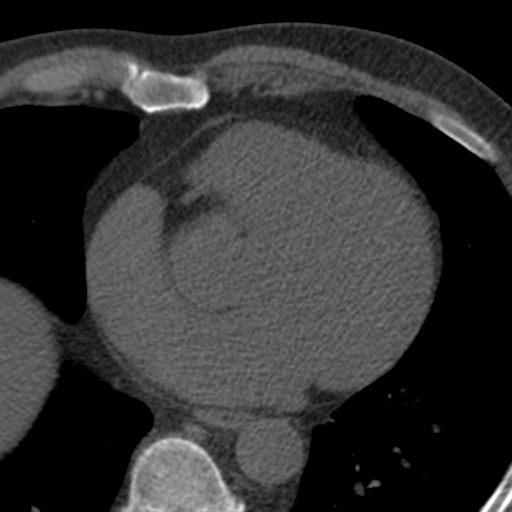

[Series 4: ax st · axial · 0.83mm/px · z∈[+1120,+1218]mm · 6 of 69 slices shown, 8 images]
[im 10/69  vessel]
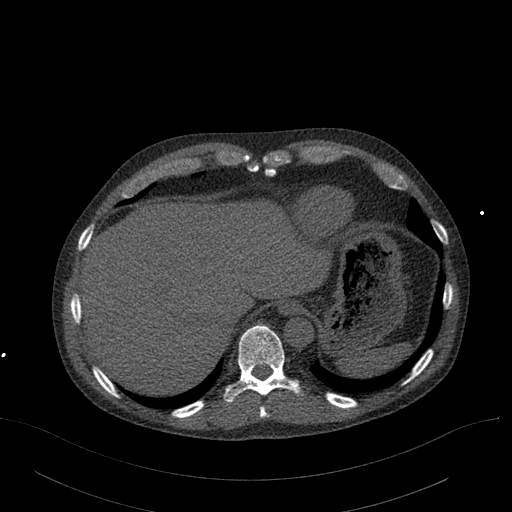
[im 10/69  lung]
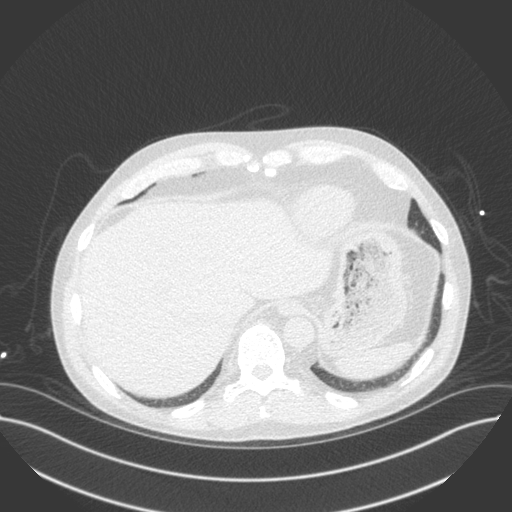
[im 20/69  vessel]
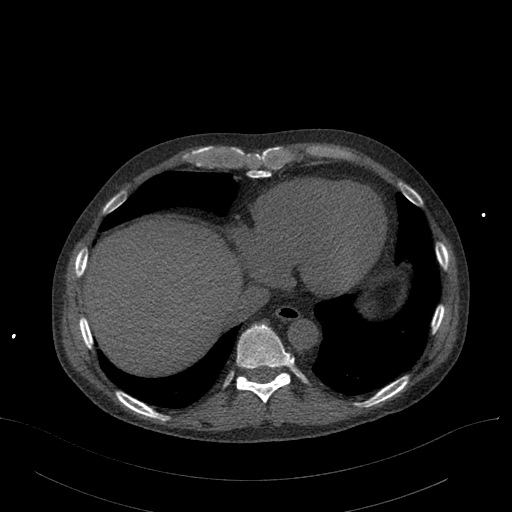
[im 30/69  vessel]
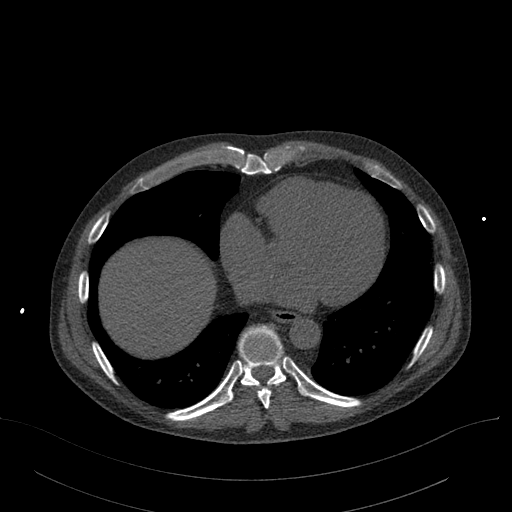
[im 39/69  vessel]
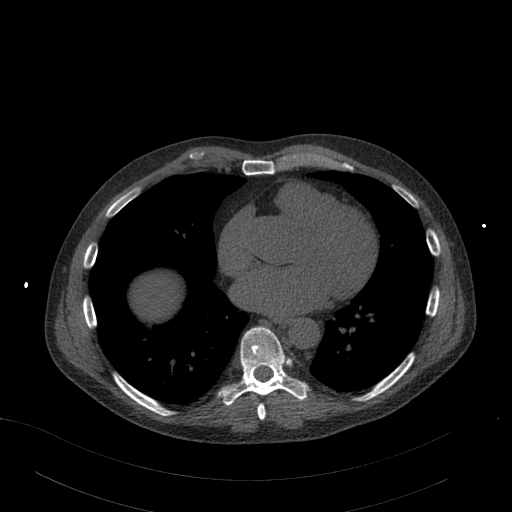
[im 49/69  vessel]
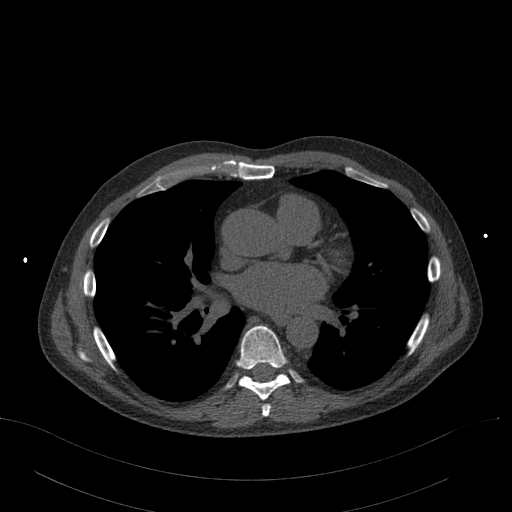
[im 49/69  lung]
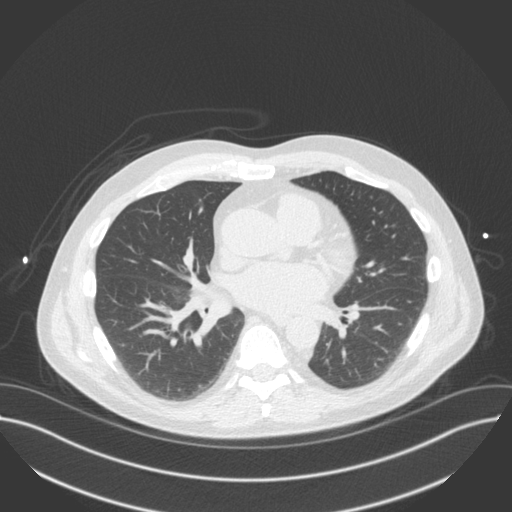
[im 59/69  vessel]
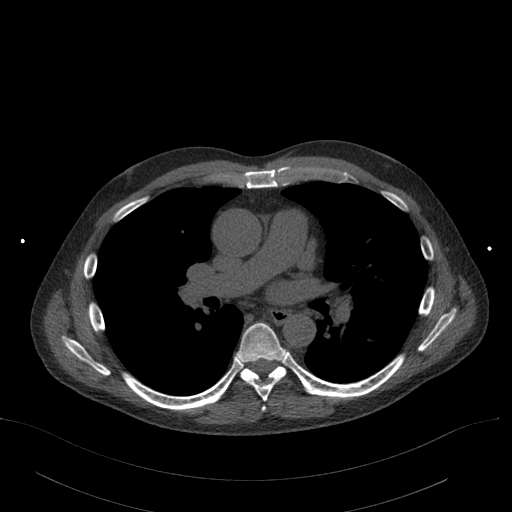

[13 of 20 positions shown; findings below may reference images not displayed]

FINDINGS: Coronary arteries: Normal origins.

Coronary Calcium Score:

Left main: 0

Left anterior descending artery:

Left circumflex artery: 0

Right coronary artery: 0

Total:

Percentile: 37th

Pericardium: Normal.

Ascending Aorta: Borderline dilated to 39 mm at the level of the
main PA bifurcation.

Non-cardiac: See separate report from [REDACTED].

Dilated main pulmonary artery to 32 mm, suggestive of pulmonary
hypertension.
IMPRESSION: 1. Coronary calcium score of 6.71. This was 37th percentile for
age-, race-, and sex-matched controls.
2. Borderline dilated aorta to 39 mm at the level of the main PA
bifurcation.
3. Dilated main pulmonary artery to 32 mm, suggestive of pulmonary
hypertension.



If CAC=0, it is reasonable to withhold statin therapy and reassess
in 5 to 10 years, as long as higher risk conditions are absent
(diabetes mellitus, family history of premature CHD in first degree
relatives (males <55 years; females <65 years), cigarette smoking,
or LDL >=190 mg/dL).

If CAC is 1 to 99, it is reasonable to initiate statin therapy for
patients >=55 years of age.

If CAC is >=100 or >=75th percentile, it is reasonable to initiate
statin therapy at any age.

Cardiology referral should be considered for patients with CAC
scores >=400 or >=75th percentile.

*9431 AHA/ACC/AACVPR/AAPA/ABC/MORTTI/KALAHARI/YUSUF/Mitkness/NADHYMA/BEDVASIS/ALBETH
Guideline on the Management of Blood Cholesterol: A Report of the
American College of Cardiology/American Heart Association Task Force
on Clinical Practice Guidelines. J Am Coll Cardiol.
4603;73(24):6228-6329.

EXAM:
OVER-READ INTERPRETATION  CT CHEST

The following report is an over-read performed by radiologist Dr.
over-read does not include interpretation of cardiac or coronary
anatomy or pathology. The CTA interpretation by the cardiologist is
attached.
FINDINGS: Limited view of the lung parenchyma demonstrates no suspicious
nodularity. Airways are normal.

Limited view of the mediastinum demonstrates no adenopathy.
Esophagus normal.

Limited view of the upper abdomen unremarkable.

Limited view of the skeleton and chest wall is unremarkable.
IMPRESSION: No significant extracardiac findings.

*** End of Addendum ***
FINDINGS: Coronary arteries: Normal origins.

Coronary Calcium Score:

Left main: 0

Left anterior descending artery:

Left circumflex artery: 0

Right coronary artery: 0

Total:

Percentile: 37th

Pericardium: Normal.

Ascending Aorta: Borderline dilated to 39 mm at the level of the
main PA bifurcation.

Non-cardiac: See separate report from [REDACTED].

Dilated main pulmonary artery to 32 mm, suggestive of pulmonary
hypertension.
IMPRESSION: 1. Coronary calcium score of 6.71. This was 37th percentile for
age-, race-, and sex-matched controls.
2. Borderline dilated aorta to 39 mm at the level of the main PA
bifurcation.
3. Dilated main pulmonary artery to 32 mm, suggestive of pulmonary
hypertension.



If CAC=0, it is reasonable to withhold statin therapy and reassess
in 5 to 10 years, as long as higher risk conditions are absent
(diabetes mellitus, family history of premature CHD in first degree
relatives (males <55 years; females <65 years), cigarette smoking,
or LDL >=190 mg/dL).

If CAC is 1 to 99, it is reasonable to initiate statin therapy for
patients >=55 years of age.

If CAC is >=100 or >=75th percentile, it is reasonable to initiate
statin therapy at any age.

Cardiology referral should be considered for patients with CAC
scores >=400 or >=75th percentile.

*9431 AHA/ACC/AACVPR/AAPA/ABC/MORTTI/KALAHARI/YUSUF/Mitkness/NADHYMA/BEDVASIS/ALBETH
Guideline on the Management of Blood Cholesterol: A Report of the
American College of Cardiology/American Heart Association Task Force
on Clinical Practice Guidelines. J Am Coll Cardiol.
4603;73(24):6228-6329.

## 2023-03-06 NOTE — Progress Notes (Signed)
Cardiology Clinic Note   Date: 03/09/2023 ID: [name], DOB 1961/03/11, MRN [id number]  Primary Cardiologist:  Nanetta Batty, MD  Patient Profile    [name] is a 62 y.o. male who presents to the clinic today for 1 year follow-up.   Past medical history significant for: PAF.  14-day ZIO 08/23/2021: SR/ST/SB. Occasional PACs/PVCs. Short runs of SVT.  Aortic dilatation.  CT cardiac scoring 07/21/2021: Calcium score 6.71 (37th percentile). Borderline dilated aorta 39 mm. Dilated main pulmonary artery 32 mm suggestive of pulmonary hypertension.  Echo 07/21/2021: EF 60-65%. Mild concentric LVH. Moderate LAE. Aortic dilatation noted. Hypertension.  Hyperlipidemia. Lipid panel 11/10/2022: LDL 94, HDL 50, TG 55, total 155.   History of Present Illness    [name] was first evaluated by Dr. Allyson Sabal on 07/12/2021 for PAF at the request of Dr. Wylene Simmer. Patient had a presyncopal episode on 06/23/2021 and was evaluated by PCP who found he was in A-fib.  He was evaluated by A-fib clinic on 08/26/2021 to discuss rhythm control options.  Given infrequency of episodes he opted to continue therapy of metoprolol and Eliquis.  Patient was last seen in the office by Dr. Allyson Sabal on 01/10/2022 for follow-up.  At that time he reported 1 episode of A-fib in January 2023 after drinking 2 Stevia Coca-Colas with heart rate up to 188 bpm per his Apple Watch.  He was back to sinus rhythm by morning.  Today, patient is doing well. Patient denies shortness of breath or dyspnea on exertion. No chest pain, pressure, or tightness. Denies lower extremity edema, orthopnea, or PND. No palpitations.  He has not had any runs of afib or alerts from his Apple Watch. He does wear his watch a little loose so when he intentionally checks his rhythm it may say there is not a good enough connection. He states he will be aware of afib if it is going fast but if it is a normal rate he may not pick up on it. When he was first  diagnosed with afib he went to the PCP because he "didn't feel right." Discussed that feeling as well as feeling fatigued or intolerant of activity may be a sign. He is very active walking 3.5-4 miles 4 times a week.    ROS: All other systems reviewed and are otherwise negative except as noted in History of Present Illness.  Studies Reviewed    ECG personally reviewed by me today: NSR, 72 bpm.  No significant changes from 01/11/2023.  Labs from PCP reviewed from 11/10/2022: HGB 15.8, Potassium 4.1, AST 23, ALT 25, CRT 0.7  Risk Assessment/Calculations     CHA2DS2-VASc Score = 1   This indicates a 0.6% annual risk of stroke. The patient's score is based upon: CHF History: 0 HTN History: 1 Diabetes History: 0 Stroke History: 0 Vascular Disease History: 0 Age Score: 0 Gender Score: 0              Physical Exam    VS:  BP 114/76   Pulse 72   Ht 5\' 11"  (1.803 m)   Wt 191 lb 6.4 oz (86.8 kg)   SpO2 98%   BMI 26.69 kg/m  , BMI Body mass index is 26.69 kg/m.  GEN: Well nourished, well developed, in no acute distress. Neck: No JVD or carotid bruits. Cardiac:  RRR. No murmurs. No rubs or gallops.   Respiratory:  Respirations regular and unlabored. Clear to auscultation without rales, wheezing or rhonchi. GI: Soft,  nontender, nondistended. Extremities: Radials/DP/PT 2+ and equal bilaterally. No clubbing or cyanosis. No edema.  Skin: Warm and dry, no rash. Neuro: Strength intact.  Assessment & Plan    PAF.  Onset September 2022.  Patient has awareness if his heart is racing, but if he is in afib with a normal rate he will not feel it. He states with his first episode he "didn't feel right." Discussed watching for that as well as feeling fatigued or having activity intolerance as a sign he may be out of rhythm.  He monitors his rhythm with his Apple Watch.  Patient has not had any afib detected on his watch. Denies spontaneous bleeding concerns.  Continue metoprolol and Eliquis.  Labs drawn by PCP.  Hypertension. BP today 114/76. Patient denies headaches, dizziness or vision changes. Continue metoprolol. Hyperlipidemia.  LDL January 2024 94. Continue simvastatin. Followed by PCP.   Disposition: Return in 1 year or sooner as needed.         Signed, Etta Grandchild. Lamiya Naas, DNP, NP-C

## 2023-03-09 ENCOUNTER — Encounter: Payer: Self-pay | Admitting: Student

## 2023-03-09 ENCOUNTER — Ambulatory Visit: Payer: 59 | Attending: Student | Admitting: Student

## 2023-03-09 VITALS — BP 114/76 | HR 72 | Ht 71.0 in | Wt 191.4 lb

## 2023-03-09 DIAGNOSIS — I10 Essential (primary) hypertension: Secondary | ICD-10-CM

## 2023-03-09 NOTE — Patient Instructions (Signed)
Medication Instructions:  Your physician recommends that you continue on your current medications as directed. Please refer to the Current Medication list given to you today.  *If you need a refill on your cardiac medications before your next appointment, please call your pharmacy*   Lab Work: NONE If you have labs (blood work) drawn today and your tests are completely normal, you will receive your results only by: MyChart Message (if you have MyChart) OR A paper copy in the mail If you have any lab test that is abnormal or we need to change your treatment, we will call you to review the results.   Testing/Procedures: NONE   Follow-Up: At [facility], you and your health needs are our priority.  As part of our continuing mission to provide you with exceptional heart care, we have created designated Provider Care Teams.  These Care Teams include your primary Cardiologist (physician) and Advanced Practice Providers (APPs -  Physician Assistants and Nurse Practitioners) who all work together to provide you with the care you need, when you need it.  We recommend signing up for the patient portal called "MyChart".  Sign up information is provided on this After Visit Summary.  MyChart is used to connect with patients for Virtual Visits (Telemedicine).  Patients are able to view lab/test results, encounter notes, upcoming appointments, etc.  Non-urgent messages can be sent to your provider as well.   To learn more about what you can do with MyChart, go to [email or web address].    Your next appointment:   1 year(s)  Provider:   Jonathan Berry, MD    

## 2024-03-04 ENCOUNTER — Encounter: Payer: Self-pay | Admitting: Cardiovascular Disease

## 2024-03-04 ENCOUNTER — Ambulatory Visit: Attending: Cardiovascular Disease | Admitting: Cardiovascular Disease

## 2024-03-04 VITALS — BP 120/80 | HR 75 | Ht 71.0 in | Wt 193.0 lb

## 2024-03-04 DIAGNOSIS — I10 Essential (primary) hypertension: Secondary | ICD-10-CM

## 2024-03-04 DIAGNOSIS — R931 Abnormal findings on diagnostic imaging of heart and coronary circulation: Secondary | ICD-10-CM

## 2024-03-04 DIAGNOSIS — I480 Paroxysmal atrial fibrillation: Secondary | ICD-10-CM | POA: Diagnosis not present

## 2024-03-04 DIAGNOSIS — E782 Mixed hyperlipidemia: Secondary | ICD-10-CM | POA: Diagnosis not present

## 2024-03-04 MED ORDER — ATORVASTATIN CALCIUM 40 MG PO TABS: 40.0000 mg | ORAL_TABLET | Freq: Every day | ORAL | 3 refills | Status: AC

## 2024-03-04 NOTE — Patient Instructions (Signed)
 Medication Instructions:   STOP simvastatin (ZOCOR) 40 MG tablet   START atorvastatin (LIPITOR) 40 MG BY MOUTH DAILY    *If you need a refill on your cardiac medications before your next appointment, please call your pharmacy*   Lab Work: Your physician recommends that you return for lab work in 3 MONTHS FOR   - LIPID PANEL  - LIVER FUNCTION TEST     If you have labs (blood work) drawn today and your tests are completely normal, you will receive your results only by: MyChart Message (if you have MyChart) OR A paper copy in the mail If you have any lab test that is abnormal or we need to change your treatment, we will call you to review the results.   Testing/Procedures: NONE    Follow-Up: At [facility], you and your health needs are our priority.  As part of our continuing mission to provide you with exceptional heart care, we have created designated Provider Care Teams.  These Care Teams include your primary Cardiologist (physician) and Advanced Practice Providers (APPs -  Physician Assistants and Nurse Practitioners) who all work together to provide you with the care you need, when you need it.  We recommend signing up for the patient portal called "MyChart".  Sign up information is provided on this After Visit Summary.  MyChart is used to connect with patients for Virtual Visits (Telemedicine).  Patients are able to view lab/test results, encounter notes, upcoming appointments, etc.  Non-urgent messages can be sent to your provider as well.   To learn more about what you can do with MyChart, go to [email or web address].    Your next appointment:   1 year(s)  The format for your next appointment:   In Person  Provider:   Lauro Portal, MD   Other Instructions

## 2024-03-04 NOTE — Progress Notes (Signed)
 03/04/2024 [name]   [birth year]  [id number]  Primary Physician Tisovec, Kristina Pfeiffer, MD Primary Cardiologist: Avanell Leigh MD Bennye Bravo, [place]  HPI:  [name] is a 63 y.o.  mildly overweight divorced Caucasian male father of 3 children who works as a Psychologist, forensic.  He was referred by Dr. Tisovec,  his  PCP, for evaluation of PAF.  I last saw him in the office 01/10/2022.  His cardiac risk factor profile is notable for treated hypertension and hyperlipidemia.  He does drink 3-5 diet Cokes a day.  There is no history of sleep apnea.  There is no family history of heart disease although his mother did have A. fib and had a stroke.  He has never had a heart attack or stroke.  Denies chest pain or shortness of breath.  He does walk 3-4 times a week and plays occasional tennis.  He developed a presyncopal episode on 06/23/2021 and was seen by his primary care doctor who noted that he was in A. fib.  I obtained an event monitor that showed short runs of SVT but no A-fib.  A 2D echo was normal and a coronary calcium score was 6.7.  He has had 1 episode of PAF on 11/04/2021 after drinking 2 Stevia Coca-Cola's.  His heart rate up to 188 on his Apple watch.  He fell asleep and in the morning was back in normal sinus rhythm.  He was completely asymptomatic during this episode.  Since I saw him 2 years ago he has remained completely asymptomatic.  He no longer plays tennis but does walk 4 miles 4 times a week without symptoms.  He remains on Eliquis for stroke prevention.   Current Meds  Medication Sig   acyclovir (ZOVIRAX) 400 MG tablet Take 400 mg by mouth daily.   Biotin w/ Vitamins C & E (HAIR/SKIN/NAILS) 1250-7.5-7.5 MCG-MG-UNT CHEW Chew by mouth.   ELIQUIS 5 MG TABS tablet Take 5 mg by mouth 2 (two) times daily.   folic acid (FOLVITE) 400 MCG tablet Take 400 mg by mouth daily.   latanoprost (XALATAN) 0.005 % ophthalmic solution Place into both eyes at bedtime.    metoprolol tartrate (LOPRESSOR) 25 MG tablet Take 25 mg by mouth 2 (two) times daily.   MINOXIDIL, TOPICAL, (ROGAINE EXTRA STRENGTH) 5 % SOLN Apply topically.   Omega-3 1000 MG CAPS Take by mouth daily.   timolol (TIMOPTIC) 0.5 % ophthalmic solution Place into both eyes.   [DISCONTINUED] simvastatin (ZOCOR) 40 MG tablet Take 40 mg by mouth every evening.     No Known Allergies  Social History   Socioeconomic History   Marital status: Divorced    Spouse name: Not on file   Number of children: Not on file   Years of education: Not on file   Highest education level: Not on file  Occupational History   Not on file  Tobacco Use   Smoking status: Never   Smokeless tobacco: Never  Substance and Sexual Activity   Alcohol use: Yes    Alcohol/week: 4.0 - 8.0 standard drinks of alcohol    Types: 4 - 8 Cans of beer per week    Comment: 2-4 beers every other weekend 08/26/2021   Drug use: Never   Sexual activity: Not on file  Other Topics Concern   Not on file  Social History Narrative   Not on file   Social Drivers of Health   Financial Resource Strain:  Not on file  Food Insecurity: Not on file  Transportation Needs: Not on file  Physical Activity: Not on file  Stress: Not on file  Social Connections: Not on file  Intimate Partner Violence: Not on file     Review of Systems: General: negative for chills, fever, night sweats or weight changes.  Cardiovascular: negative for chest pain, dyspnea on exertion, edema, orthopnea, palpitations, paroxysmal nocturnal dyspnea or shortness of breath Dermatological: negative for rash Respiratory: negative for cough or wheezing Urologic: negative for hematuria Abdominal: negative for nausea, vomiting, diarrhea, bright red blood per rectum, melena, or hematemesis Neurologic: negative for visual changes, syncope, or dizziness All other systems reviewed and are otherwise negative except as noted above.    Blood pressure 120/80, pulse 75,  height 5\' 11"  (1.803 m), weight 193 lb (87.5 kg), SpO2 96%.  General appearance: alert and no distress Neck: no adenopathy, no carotid bruit, no JVD, supple, symmetrical, trachea midline, and thyroid not enlarged, symmetric, no tenderness/mass/nodules Lungs: clear to auscultation bilaterally Heart: regular rate and rhythm, S1, S2 normal, no murmur, click, rub or gallop Extremities: extremities normal, atraumatic, no cyanosis or edema Pulses: 2+ and symmetric Skin: Skin color, texture, turgor normal. No rashes or lesions Neurologic: Grossly normal  EKG EKG Interpretation Date/Time:  Tuesday Mar 04 2024 10:11:18 EDT Ventricular Rate:  72 PR Interval:  144 QRS Duration:  96 QT Interval:  406 QTC Calculation: 444 R Axis:   -28  Text Interpretation: Normal sinus rhythm Normal ECG When compared with ECG of 26-Aug-2021 09:41, QRS axis Shifted left Confirmed by Lauro Portal [phone]) on 03/04/2024 10:16:31 AM    ASSESSMENT AND PLAN:   Essential hypertension History of essential hypertension with blood pressure measured today at 120/80.  He is on metoprolol.  Hyperlipidemia History of hyperlipidemia on simvastatin 40 mg a day with lipid profile performed 11/19/2023 revealing total cholesterol 165, LDL of 90 and HDL 58, not at goal for secondary prevention given his mildly elevated coronary calcium score.  I am going to change his simvastatin to atorvastatin 40 mg a day and we will recheck a lipid liver profile in 3 months.  LDL goal less than 70.  Paroxysmal atrial fibrillation (HCC) History of PAF with CV risk of 1 on Eliquis at his request.  He has had no recurrence clinically.  I did do a 2-week event monitor on him 07/27/2021 that did not show any A-fib but did have short runs of SVT.  Elevated coronary artery calcium score Coronary calcium score of 6.7 performed 07/21/2021 all in the LAD territory.  He is completely asymptomatic however.     Avanell Leigh MD FACP,FACC,FAHA,  [facility] 03/04/2024 10:28 AM

## 2024-03-04 NOTE — Assessment & Plan Note (Signed)
 Coronary calcium score of 6.7 performed 07/21/2021 all in the LAD territory.  He is completely asymptomatic however.

## 2024-03-04 NOTE — Assessment & Plan Note (Signed)
 History of PAF with CV risk of 1 on Eliquis at his request.  He has had no recurrence clinically.  I did do a 2-week event monitor on him 07/27/2021 that did not show any A-fib but did have short runs of SVT.

## 2024-03-04 NOTE — Assessment & Plan Note (Signed)
 History of essential hypertension with blood pressure measured today at 120/80.  He is on metoprolol.

## 2024-03-04 NOTE — Assessment & Plan Note (Signed)
 History of hyperlipidemia on simvastatin 40 mg a day with lipid profile performed 11/19/2023 revealing total cholesterol 165, LDL of 90 and HDL 58, not at goal for secondary prevention given his mildly elevated coronary calcium score.  I am going to change his simvastatin to atorvastatin 40 mg a day and we will recheck a lipid liver profile in 3 months.  LDL goal less than 70.

## 2024-03-18 ENCOUNTER — Other Ambulatory Visit: Payer: Self-pay

## 2024-03-18 ENCOUNTER — Emergency Department (HOSPITAL_BASED_OUTPATIENT_CLINIC_OR_DEPARTMENT_OTHER)
Admission: EM | Admit: 2024-03-18 | Discharge: 2024-03-18 | Disposition: A | Discharge status: Home / Self Care | Attending: Emergency Medicine | Admitting: Emergency Medicine

## 2024-03-18 ENCOUNTER — Emergency Department (HOSPITAL_BASED_OUTPATIENT_CLINIC_OR_DEPARTMENT_OTHER)

## 2024-03-18 DIAGNOSIS — I480 Paroxysmal atrial fibrillation: Secondary | ICD-10-CM | POA: Insufficient documentation

## 2024-03-18 DIAGNOSIS — T782XXA Anaphylactic shock, unspecified, initial encounter: Secondary | ICD-10-CM | POA: Diagnosis not present

## 2024-03-18 DIAGNOSIS — Z7901 Long term (current) use of anticoagulants: Secondary | ICD-10-CM | POA: Diagnosis not present

## 2024-03-18 DIAGNOSIS — Z79899 Other long term (current) drug therapy: Secondary | ICD-10-CM | POA: Insufficient documentation

## 2024-03-18 DIAGNOSIS — I10 Essential (primary) hypertension: Secondary | ICD-10-CM | POA: Insufficient documentation

## 2024-03-18 LAB — BASIC METABOLIC PANEL WITH GFR
Anion gap: 18 — ABNORMAL HIGH (ref 5–15)
BUN: 17 mg/dL (ref 8–23)
CO2: 21 mmol/L — ABNORMAL LOW (ref 22–32)
Calcium: 9.7 mg/dL (ref 8.9–10.3)
Chloride: 99 mmol/L (ref 98–111)
Creatinine, Ser: 1.08 mg/dL (ref 0.61–1.24)
GFR, Estimated: >60 mL/min (ref >60)
Glucose, Bld: 178 mg/dL — ABNORMAL HIGH (ref 70–99)
Potassium: 4 mmol/L (ref 3.5–5.1)
Sodium: 138 mmol/L (ref 135–145)

## 2024-03-18 LAB — CBC
HCT: 48.2 % (ref 39.0–52.0)
Hemoglobin: 17.1 g/dL — ABNORMAL HIGH (ref 13.0–17.0)
MCH: 32.1 pg (ref 26.0–34.0)
MCHC: 35.5 g/dL (ref 30.0–36.0)
MCV: 90.4 fL (ref 80.0–100.0)
Platelets: 207 10*3/uL (ref 150–400)
RBC: 5.33 MIL/uL (ref 4.22–5.81)
RDW: 11.7 % (ref 11.5–15.5)
WBC: 17.2 10*3/uL — ABNORMAL HIGH (ref 4.0–10.5)
nRBC: 0 % (ref 0.0–0.2)

## 2024-03-18 MED ORDER — FAMOTIDINE IN NACL 20-0.9 MG/50ML-% IV SOLN
20.0000 mg | Freq: Once | INTRAVENOUS | Status: AC
  Administered 2024-03-18: 20 mg via INTRAVENOUS
  Filled 2024-03-18: qty 50

## 2024-03-18 MED ORDER — EPINEPHRINE 0.3 MG/0.3ML IJ SOAJ: 0.3000 mg | INTRAMUSCULAR | 0 refills | Status: AC | PRN

## 2024-03-18 MED ORDER — EPINEPHRINE 0.3 MG/0.3ML IJ SOAJ
0.3000 mg | Freq: Once | INTRAMUSCULAR | Status: AC
  Administered 2024-03-18: 0.3 mg via INTRAMUSCULAR
  Filled 2024-03-18: qty 0.3

## 2024-03-18 MED ORDER — METHYLPREDNISOLONE SODIUM SUCC 125 MG IJ SOLR
125.0000 mg | Freq: Once | INTRAMUSCULAR | Status: AC
  Administered 2024-03-18: 125 mg via INTRAVENOUS
  Filled 2024-03-18: qty 2

## 2024-03-18 MED ORDER — DIPHENHYDRAMINE HCL 50 MG/ML IJ SOLN
25.0000 mg | Freq: Once | INTRAMUSCULAR | Status: AC
  Administered 2024-03-18: 25 mg via INTRAVENOUS
  Filled 2024-03-18: qty 1

## 2024-03-18 MED ORDER — SODIUM CHLORIDE 0.9 % IV BOLUS
1000.0000 mL | Freq: Once | INTRAVENOUS | Status: AC
  Administered 2024-03-18: 1000 mL via INTRAVENOUS

## 2024-03-18 NOTE — ED Provider Notes (Signed)
 [facility] EMERGENCY DEPARTMENT AT [facility] Provider Note   CSN: [id number] Arrival date & time: 03/18/24  1440     History  Chief Complaint  Patient presents with   Allergic Reaction    [name] is a 63 y.o. male past medical history significant for hypertension, paroxysmal A-fib who presents with concern for allergic reaction to bee sting today around noon.  Has had history of similar.  Approximately 40 minutes after the event.  He started having swelling to hands, full body hives, he reports [place] some mild nausea with salivation and then had a syncopal episode.  He does not recall hitting his head but does feel that he fully lost consciousness, he thinks he just fell to his knees because of the pattern of dirt on his knees but is not sure.  No head pain, no neck pain.  No difficulty breathing, no chest pain, no current nausea or vomiting.  Nothing for allergic reaction prior to arrival.   Allergic Reaction      Home Medications Prior to Admission medications   Medication Sig Start Date End Date Taking? Authorizing Provider  acyclovir (ZOVIRAX) 400 MG tablet Take 400 mg by mouth daily. 06/27/21  Yes [provider]  atorvastatin  (LIPITOR) 40 MG tablet Take 1 tablet (40 mg total) by mouth daily. 03/04/24 06/02/24 Yes Avanell Leigh, MD  Biotin w/ Vitamins C & E (HAIR/SKIN/NAILS) 1250-7.5-7.5 MCG-MG-UNT CHEW Chew by mouth.   Yes [provider]  ELIQUIS 5 MG TABS tablet Take 5 mg by mouth 2 (two) times daily. 06/24/21  Yes [provider]  EPINEPHrine 0.3 mg/0.3 mL IJ SOAJ injection Inject 0.3 mg into the muscle as needed for anaphylaxis. 03/18/24  Yes Shandell Giovanni H, PA-C  folic acid (FOLVITE) 400 MCG tablet Take 400 mg by mouth daily.   Yes [provider]  latanoprost (XALATAN) 0.005 % ophthalmic solution Place into both eyes at bedtime. 11/17/20  Yes [provider]  metoprolol tartrate (LOPRESSOR) 25 MG tablet Take  25 mg by mouth 2 (two) times daily. 06/24/21  Yes [provider]  MINOXIDIL, TOPICAL, (ROGAINE EXTRA STRENGTH) 5 % SOLN Apply topically.   Yes [provider]  Omega-3 Fatty Acids (SUPER OMEGA 3 EPA/DHA) 1000 MG CAPS Take 1 g by mouth daily. 12/01/14  Yes [provider]  timolol (TIMOPTIC) 0.5 % ophthalmic solution Place into both eyes. 10/03/17  Yes [provider]      Allergies    Hornet venom    Review of Systems   Review of Systems  All other systems reviewed and are negative.   Physical Exam Updated Vital Signs BP 109/78 (BP Location: Left Arm)   Pulse 98   Temp 98.5 F (36.9 C) (Oral)   Resp (!) 23   SpO2 97%  Physical Exam Vitals and nursing note reviewed.  Constitutional:      General: He is not in acute distress.    Appearance: Normal appearance.  HENT:     Head: Normocephalic and atraumatic.     Mouth/Throat:     Comments: Oropharynx is clear, no tongue, lip edema, no posterior oropharyngeal edema, uvula midline.  No stridor. Eyes:     General:        Right eye: No discharge.        Left eye: No discharge.  Cardiovascular:     Rate and Rhythm: Normal rate and regular rhythm.     Heart sounds: No murmur heard.  No friction rub. No gallop.  Pulmonary:     Effort: Pulmonary effort is normal.     Breath sounds: Normal breath sounds.     Comments: No wheezing, rhonchi, stridor, rales, no respiratory distress. Abdominal:     General: Bowel sounds are normal.     Palpations: Abdomen is soft.  Skin:    General: Skin is warm and dry.     Capillary Refill: Capillary refill takes less than 2 seconds.     Comments: Patient with diffuse scattered hives, some swelling of his hands, he has no swelling around the lips, mouth.  Neurological:     Mental Status: He is alert and oriented to person, place, and time.  Psychiatric:        Mood and Affect: Mood normal.        Behavior: Behavior normal.     ED Results / Procedures /  Treatments   Labs (all labs ordered are listed, but only abnormal results are displayed) Labs Reviewed  CBC - Abnormal; Notable for the following components:      Result Value   WBC 17.2 (*)    Hemoglobin 17.1 (*)    All other components within normal limits  BASIC METABOLIC PANEL WITH GFR - Abnormal; Notable for the following components:   CO2 21 (*)    Glucose, Bld 178 (*)    Anion gap 18 (*)    All other components within normal limits    EKG EKG Interpretation Date/Time:  Tuesday March 18 2024 15:13:38 EDT Ventricular Rate:  103 PR Interval:  141 QRS Duration:  87 QT Interval:  345 QTC Calculation: 452 R Axis:   157  Text Interpretation: Sinus tachycardia Probable left atrial enlargement Right axis deviation Low voltage, precordial leads Abnormal R-wave progression, late transition Borderline T abnormalities, inferior leads No significant change since last tracing Confirmed by Trish Furl [phone]) on 03/18/2024 5:44:36 PM  Radiology CT Head Wo Contrast Result Date: 03/18/2024 CLINICAL DATA:  Head trauma, moderate-severe EXAM: CT HEAD WITHOUT CONTRAST TECHNIQUE: Contiguous axial images were obtained from the base of the skull through the vertex without intravenous contrast. RADIATION DOSE REDUCTION: This exam was performed according to the departmental dose-optimization program which includes automated exposure control, adjustment of the mA and/or kV according to patient size and/or use of iterative reconstruction technique. COMPARISON:  None Available. FINDINGS: Brain: No intracranial hemorrhage, mass effect, or midline shift. No hydrocephalus. The basilar cisterns are patent. No evidence of territorial infarct or acute ischemia. No extra-axial or intracranial fluid collection. Vascular: No hyperdense vessel or unexpected calcification. Skull: No fracture or focal lesion. Sinuses/Orbits: Paranasal sinuses and mastoid air cells are clear. The visualized orbits are unremarkable. Surgical  change in the left lobe. Other: None. IMPRESSION: No acute intracranial abnormality. No skull fracture. Electronically Signed   By: Chadwick Colonel M.D.   On: 03/18/2024 17:02    Procedures Procedures    Medications Ordered in ED Medications  EPINEPHrine (EPI-PEN) injection 0.3 mg (0.3 mg Intramuscular Given 03/18/24 1508)  methylPREDNISolone sodium succinate (SOLU-MEDROL) 125 mg/2 mL injection 125 mg (125 mg Intravenous Given 03/18/24 1512)  sodium chloride 0.9 % bolus 1,000 mL (0 mLs Intravenous Stopped 03/18/24 1654)  diphenhydrAMINE (BENADRYL) injection 25 mg (25 mg Intravenous Given 03/18/24 1506)  famotidine (PEPCID) IVPB 20 mg premix (0 mg Intravenous Stopped 03/18/24 1554)    ED Course/ Medical Decision Making/ A&P Clinical Course as of 03/18/24 1905  Tue Mar 18, 2024  1811 Pulse Rate(!): 109 [CP]  Clinical Course User Index [CP] Nelly Banco, PA-C                                 Medical Decision Making  This patient is a 63 y.o. male  who presents to the ED for concern of allergic reaction.   Differential diagnoses prior to evaluation: The emergent differential diagnosis includes, but is not limited to, localized versus diffuse systemic allergic reaction, anaphylaxis. This is not an exhaustive differential.   Past Medical History / Co-morbidities / Social History: Paroxysmal A-fib, hypertension, hyperlipidemia medication controlled  Physical Exam: Physical exam performed. The pertinent findings include: Initially with diffuse hives, but no abdominal tenderness, no wheezing, rhonchi, stridor, rales.  Lab Tests/Imaging studies: I personally interpreted labs/imaging and the pertinent results include: CBC notable for leukocytosis, blood cell 17.2, hemoglobin 17.1, BMP with bicarb deficit, CO2 21, and anion gap of 18.  I independently turbid CT head without contrast which shows no evidence of acute intracranial abnormality.. I agree with the radiologist  interpretation.  Cardiac monitoring: EKG obtained and interpreted by myself and attending physician which shows: Sinus tachycardia, otherwise no acute ST-T changes consistent from recent previous   Medications: I ordered medication including fluid bolus, Pepcid, Solu-Medrol, EpiPen, Benadryl for.  I have reviewed the patients home medicines and have made adjustments as needed.   Disposition: After consideration of the diagnostic results and the patients response to treatment, I feel that patient monitored in ED for 4 hours, symptoms significantly improved, will plan to discharge with EpiPen, encouraged him to continue to monitor his symptoms closely.  Return precautions given..   emergency department workup does not suggest an emergent condition requiring admission or immediate intervention beyond what has been performed at this time. The plan is: as above. The patient is safe for discharge and has been instructed to return immediately for worsening symptoms, change in symptoms or any other concerns.  Final Clinical Impression(s) / ED Diagnoses Final diagnoses:  Anaphylaxis, initial encounter    Rx / DC Orders ED Discharge Orders          Ordered    EPINEPHrine 0.3 mg/0.3 mL IJ SOAJ injection  As needed        03/18/24 1854              Jaimy Kliethermes, MEER, REINDL 03/18/24 Mickael Alamo, MD 03/20/24 [phone]

## 2024-03-18 NOTE — Discharge Instructions (Signed)
 Please monitor for significant return of rash, syncope or near syncope, nausea, vomiting, difficulty breathing, mouth swelling.  I have sent a prescription for an EpiPen to your pharmacy, I recommend that you pick it up in case this happens in the future you can administer at the site of the incident.  You can use Benadryl, Pepcid as needed over the next 2 to 3 days if you continue to have any hives, itching.

## 2024-03-18 NOTE — ED Triage Notes (Signed)
 Allergic reaction to bee sting today around noon. Hx of same. States approx 40 mins after event, patient started having swelling to hands and full body hives. Denies SHOB or trouble breathing. States became syncopal when trying to come here. Denies hitting heading head, but unsure.

## 2024-06-06 ENCOUNTER — Ambulatory Visit: Payer: Self-pay | Admitting: Cardiovascular Disease

## 2024-06-06 LAB — HEPATIC FUNCTION PANEL
ALT: 22 IU/L (ref 0–44)
AST: 23 IU/L (ref 0–40)
Albumin: 4.4 g/dL (ref 3.9–4.9)
Alkaline Phosphatase: 49 IU/L (ref 44–121)
Bilirubin Total: 1 mg/dL (ref 0.0–1.2)
Bilirubin, Direct: 0.27 mg/dL (ref 0.00–0.40)
Total Protein: 6.4 g/dL (ref 6.0–8.5)

## 2024-06-06 LAB — LIPID PANEL
Chol/HDL Ratio: 2.9 ratio (ref 0.0–5.0)
Cholesterol, Total: 157 mg/dL (ref 100–199)
HDL: 54 mg/dL
LDL Chol Calc (NIH): 91 mg/dL (ref 0–99)
Triglycerides: 62 mg/dL (ref 0–149)
VLDL Cholesterol Cal: 12 mg/dL (ref 5–40)

## 2024-07-08 ENCOUNTER — Telehealth: Payer: Self-pay

## 2024-07-08 NOTE — Telephone Encounter (Signed)
   Pre-operative Risk Assessment    Patient Name: [name]  DOB: 1961-04-26 MRN: [id number]   Date of last office visit: 03/04/24 Date of next office visit: Not scheduled   Request for Surgical Clearance    Procedure:  Colonoscopy  Date of Surgery:  Clearance 08/07/24                                Surgeon:  Dr. Layla Lah Surgeon's Group or Practice Name:  [facility] Phone number:  [phone] Fax number:  [phone]   Type of Clearance Requested:   - Medical  - Pharmacy:  Hold Apixaban (Eliquis)     Type of Anesthesia:  Propofol   Additional requests/questions:    Bonney Ival LOISE Gerome   07/08/2024, 2:07 PM

## 2024-07-08 NOTE — Telephone Encounter (Signed)
 Pharmacy, can you please comment on how long Eliquis can be held for upcoming procedure?  Patient will then need virtual visit per protocol.   Thank you!

## 2024-07-16 ENCOUNTER — Telehealth (HOSPITAL_BASED_OUTPATIENT_CLINIC_OR_DEPARTMENT_OTHER): Payer: Self-pay | Admitting: *Deleted

## 2024-07-16 NOTE — Telephone Encounter (Signed)
 Primary Cardiologist:Jonathan Court, MD   Preoperative team, please contact this patient and set up a phone call appointment for further preoperative risk assessment. Please obtain consent and complete medication review. Thank you for your help.   I confirm that guidance regarding antiplatelet and oral anticoagulation therapy has been completed and, if necessary, noted below.  Per office protocol, patient can hold Eliquis for 2 days prior to procedure and should resume as soon as hemodynamically stable post procedure.  Patient will not need bridging with Lovenox (enoxaparin) around procedure.  I also confirmed the patient resides in the state of [place] . As per [facility] Medical Board telemedicine laws, the patient must reside in the state in which the provider is licensed.   Rosaline EMERSON Bane, NP-C  07/16/2024, 7:21 AM [address] [place], [place] [address] Office [phone] Fax [phone]

## 2024-07-16 NOTE — Telephone Encounter (Signed)
 Pt has been scheduled tele preop appt 07/24/24. Med rec and consent are done.      Patient Consent for Virtual Visit        [name] has provided verbal consent on 07/16/2024 for a virtual visit (video or telephone).   CONSENT FOR VIRTUAL VISIT FOR:  [name] Lawn  By participating in this virtual visit I agree to the following:  I hereby voluntarily request, consent and authorize [facility] and its employed or contracted physicians, physician assistants, nurse practitioners or other licensed health care professionals (the Practitioner), to provide me with telemedicine health care services (the "Services) as deemed necessary by the treating Practitioner. I acknowledge and consent to receive the Services by the Practitioner via telemedicine. I understand that the telemedicine visit will involve communicating with the Practitioner through live audiovisual communication technology and the disclosure of certain medical information by electronic transmission. I acknowledge that I have been given the opportunity to request an in-person assessment or other available alternative prior to the telemedicine visit and am voluntarily participating in the telemedicine visit.  I understand that I have the right to withhold or withdraw my consent to the use of telemedicine in the course of my care at any time, without affecting my right to future care or treatment, and that the Practitioner or I may terminate the telemedicine visit at any time. I understand that I have the right to inspect all information obtained and/or recorded in the course of the telemedicine visit and may receive copies of available information for a reasonable fee.  I understand that some of the potential risks of receiving the Services via telemedicine include:  Delay or interruption in medical evaluation due to technological equipment failure or disruption; Information transmitted may not be sufficient (e.g. poor  resolution of images) to allow for appropriate medical decision making by the Practitioner; and/or  In rare instances, security protocols could fail, causing a breach of personal health information.  Furthermore, I acknowledge that it is my responsibility to provide information about my medical history, conditions and care that is complete and accurate to the best of my ability. I acknowledge that Practitioner's advice, recommendations, and/or decision may be based on factors not within their control, such as incomplete or inaccurate data provided by me or distortions of diagnostic images or specimens that may result from electronic transmissions. I understand that the practice of medicine is not an exact science and that Practitioner makes no warranties or guarantees regarding treatment outcomes. I acknowledge that a copy of this consent can be made available to me via my patient portal [facility] MyChart), or I can request a printed copy by calling the office of [facility].    I understand that my insurance will be billed for this visit.   I have read or had this consent read to me. I understand the contents of this consent, which adequately explains the benefits and risks of the Services being provided via telemedicine.  I have been provided ample opportunity to ask questions regarding this consent and the Services and have had my questions answered to my satisfaction. I give my informed consent for the services to be provided through the use of telemedicine in my medical care

## 2024-07-16 NOTE — Telephone Encounter (Signed)
 Pt has been scheduled tele preop appt 07/24/24. Med rec and consent are done.

## 2024-07-16 NOTE — Telephone Encounter (Signed)
 Patient with diagnosis of atrial fibrillation on Eliquis for anticoagulation.    Procedure:  Colonoscopy   Date of Surgery:  Clearance 08/07/24    CHA2DS2-VASc Score = 2   This indicates a 2.2% annual risk of stroke. The patient's score is based upon: CHF History: 0 HTN History: 1 Diabetes History: 0 Stroke History: 0 Vascular Disease History: 1 Age Score: 0 Gender Score: 0    CrCl 87 Platelet count 207  Patient has not had an Afib/aflutter ablation or Watchman within the last 3 months or DCCV within the last 30 days   Per office protocol, patient can hold Eliquis for 2 days prior to procedure.   Patient will not need bridging with Lovenox (enoxaparin) around procedure.  **This guidance is not considered finalized until pre-operative APP has relayed final recommendations.**

## 2024-07-24 ENCOUNTER — Ambulatory Visit: Attending: Cardiovascular Disease

## 2024-07-24 DIAGNOSIS — Z01810 Encounter for preprocedural cardiovascular examination: Secondary | ICD-10-CM

## 2024-07-24 NOTE — Progress Notes (Signed)
 Virtual Visit via Telephone Note   Because of [name] co-morbid illnesses, he is at least at moderate risk for complications without adequate follow up.  This format is felt to be most appropriate for this patient at this time.  Due to technical limitations with video connection (technology), today's appointment will be conducted as an audio only telehealth visit, and [name] verbally agreed to proceed in this manner.   All issues noted in this document were discussed and addressed.  No physical exam could be performed with this format.  Evaluation Performed:  Preoperative cardiovascular risk assessment _____________   Date:  07/24/2024   Patient ID:  [name], DOB 06-28-1961, MRN [id number] Patient Location:  Home Provider location:   Office  Primary Care Provider:  Vernadine Kent ORN, MD Primary Cardiologist:  Dorn Lesches, MD  Chief Complaint / Patient Profile   63 y.o. y/o male with a h/o hypertension, hyperlipidemia, paroxysmal atrial fibrillation who is pending colonoscopy and presents today for telephonic preoperative cardiovascular risk assessment.  History of Present Illness    [name] is a 63 y.o. male who presents via audio/video conferencing for a telehealth visit today.  Pt was last seen in cardiology clinic on 03/04/2024 by Dr. Lesches.  At that time [name] was doing well .  The patient is now pending procedure as outlined above. Since his last visit, he continues to be stable from a cardiac standpoint.  Today he denies chest pain, shortness of breath, lower extremity edema, fatigue, palpitations, melena, hematuria, hemoptysis, diaphoresis, weakness,  orthopnea, and PND.   Past Medical History    No past medical history on file. No past surgical history on file.  Allergies  Allergies  Allergen Reactions   Hornet Venom Anaphylaxis    Stung by a hornet had to receive epi.    Home Medications    Prior to Admission medications    Medication Sig Start Date End Date Taking? Authorizing Provider  acyclovir (ZOVIRAX) 400 MG tablet Take 400 mg by mouth daily. 06/27/21   [provider]  atorvastatin  (LIPITOR) 40 MG tablet Take 1 tablet (40 mg total) by mouth daily. 03/04/24 07/16/24  Lesches Dorn PARAS, MD  Biotin w/ Vitamins C & E (HAIR/SKIN/NAILS) 1250-7.5-7.5 MCG-MG-UNT CHEW Chew by mouth.    [provider]  ELIQUIS 5 MG TABS tablet Take 5 mg by mouth 2 (two) times daily. 06/24/21   [provider]  EPINEPHrine  0.3 mg/0.3 mL IJ SOAJ injection Inject 0.3 mg into the muscle as needed for anaphylaxis. 03/18/24   Prosperi, Christian H, PA-C  folic acid (FOLVITE) 400 MCG tablet Take 400 mg by mouth daily.    [provider]  latanoprost (XALATAN) 0.005 % ophthalmic solution Place into both eyes at bedtime. 11/17/20   [provider]  metoprolol tartrate (LOPRESSOR) 25 MG tablet Take 25 mg by mouth 2 (two) times daily. 06/24/21   [provider]  MINOXIDIL, TOPICAL, (ROGAINE EXTRA STRENGTH) 5 % SOLN Apply topically.    [provider]  Omega-3 Fatty Acids (SUPER OMEGA 3 EPA/DHA) 1000 MG CAPS Take 1 g by mouth daily. 12/01/14   [provider]  timolol (TIMOPTIC) 0.5 % ophthalmic solution Place into both eyes. 10/03/17   [provider]    Physical Exam    Vital Signs:  [name] does not have vital signs available for review today.  Given telephonic nature of communication, physical exam is limited. AAOx3. NAD.  Normal affect.  Speech and respirations are unlabored.  Accessory Clinical Findings    None  Assessment & Plan    1.  Preoperative Cardiovascular Risk Assessment:Colonoscopy   Date of Surgery:  Clearance 08/07/24                                  Surgeon:  Dr. Layla Kent Surgeon's Group or Practice Name:  [facility] Phone number:  [phone] Fax number:  [phone]      Primary Cardiologist: Dorn Lesches, MD  Chart  reviewed as part of pre-operative protocol coverage. Given past medical history and time since last visit, based on ACC/AHA guidelines, [name] would be at acceptable risk for the planned procedure without further cardiovascular testing.   Patient was advised that if he develops new symptoms prior to surgery to contact our office to arrange a follow-up appointment.  He verbalized understanding.  Per office protocol, patient can hold Eliquis for 2 days prior to procedure and should resume as soon as hemodynamically stable post procedure.  Patient will not need bridging with Lovenox (enoxaparin) around procedure.  I will route this recommendation to the requesting party via Epic fax function and remove from pre-op pool.       Time:   Today, I have spent 5 minutes with the patient with telehealth technology discussing medical history, symptoms, and management plan.  I spent 10 minutes reviewing patient's past cardiac history and cardiac medications.    Timothy CHRISTELLA Beauvais, NP  07/24/2024, 6:57 AM
